# Patient Record
Sex: Female | Born: 1965
Health system: Southern US, Community
[De-identification: ages and names within clinical notes are randomized; demographics above are authoritative.]

## PROBLEM LIST (undated history)

## (undated) DIAGNOSIS — A609 Anogenital herpesviral infection, unspecified: Secondary | ICD-10-CM

## (undated) DIAGNOSIS — D509 Iron deficiency anemia, unspecified: Secondary | ICD-10-CM

## (undated) DIAGNOSIS — N841 Polyp of cervix uteri: Secondary | ICD-10-CM

## (undated) DIAGNOSIS — Z973 Presence of spectacles and contact lenses: Secondary | ICD-10-CM

## (undated) DIAGNOSIS — K648 Other hemorrhoids: Secondary | ICD-10-CM

## (undated) DIAGNOSIS — J302 Other seasonal allergic rhinitis: Secondary | ICD-10-CM

## (undated) DIAGNOSIS — N924 Excessive bleeding in the premenopausal period: Secondary | ICD-10-CM

## (undated) DIAGNOSIS — K644 Residual hemorrhoidal skin tags: Secondary | ICD-10-CM

## (undated) DIAGNOSIS — D259 Leiomyoma of uterus, unspecified: Secondary | ICD-10-CM

## (undated) DIAGNOSIS — K219 Gastro-esophageal reflux disease without esophagitis: Secondary | ICD-10-CM

## (undated) DIAGNOSIS — E785 Hyperlipidemia, unspecified: Secondary | ICD-10-CM

## (undated) HISTORY — DX: Hyperlipidemia, unspecified: E78.5

## (undated) HISTORY — DX: Anogenital herpesviral infection, unspecified: A60.9

## (undated) HISTORY — PX: DILATION AND CURETTAGE OF UTERUS: SHX78

---

## 2013-08-24 DIAGNOSIS — D649 Anemia, unspecified: Secondary | ICD-10-CM | POA: Insufficient documentation

## 2013-08-24 DIAGNOSIS — E785 Hyperlipidemia, unspecified: Secondary | ICD-10-CM | POA: Insufficient documentation

## 2013-08-24 DIAGNOSIS — J309 Allergic rhinitis, unspecified: Secondary | ICD-10-CM | POA: Insufficient documentation

## 2013-10-28 ENCOUNTER — Ambulatory Visit (INDEPENDENT_AMBULATORY_CARE_PROVIDER_SITE_OTHER): Payer: BC Managed Care – PPO | Admitting: Obstetrics & Gynecology

## 2013-10-28 ENCOUNTER — Encounter: Payer: Self-pay | Admitting: Obstetrics & Gynecology

## 2013-10-28 VITALS — BP 98/59 | HR 58 | Ht 64.0 in | Wt 193.2 lb

## 2013-10-28 DIAGNOSIS — Z1151 Encounter for screening for human papillomavirus (HPV): Secondary | ICD-10-CM

## 2013-10-28 DIAGNOSIS — N644 Mastodynia: Secondary | ICD-10-CM | POA: Diagnosis not present

## 2013-10-28 DIAGNOSIS — Z124 Encounter for screening for malignant neoplasm of cervix: Secondary | ICD-10-CM

## 2013-10-28 DIAGNOSIS — Z01419 Encounter for gynecological examination (general) (routine) without abnormal findings: Secondary | ICD-10-CM

## 2013-10-28 LAB — HM PAP SMEAR: HM Pap smear: NEGATIVE

## 2013-10-28 NOTE — Progress Notes (Signed)
Patient is here for yearly exam, new to the area since April from [place].  She is having left breast swelling.  Her paps have been normal for the last 3 years.  She does have a history of fibroids and had an ultrasound prior to moving here.  She was also taking Medroxyprogesterone tablets 10mg  but has stopped taking these.

## 2013-10-28 NOTE — Addendum Note (Signed)
Addended by: Erik Obey on: 10/28/2013 04:14 PM   Modules accepted: Orders

## 2013-10-28 NOTE — Patient Instructions (Signed)

## 2013-10-28 NOTE — Progress Notes (Signed)
Patient ID: Holly Wolfe, female   DOB: [birth year], 48 y.o.   MRN: [id number] Subjective:     Holly Wolfe is a 48 y.o. female here for a routine exam.  Current complaints: right breast pain/fullness like a 'tube'.  Pt noted it 1 week prev.  Denies skin changes.  She reports that she had been on Provera until Mar 2015. She stopped it because she didn't want to be on any hormones.  Then she restated it last week 'because she felt like maybe she should be taking it' although she denies any further abnormal bleeding at the time.  She reports that AFTER restarting the Provera she began to feel this breast 'fullness'.  She reports that since that time she has been rubbing and examining her breast often because she was worried.  Pt reports was started on Provera 10mg  daily in [place] due to 'perimenopausal sx'.  She reports that while on it her bleeding returned to normal but she does NOT want to be on hormones and has NOT has irreg menses since stopping the provera.    Gynecologic History Patient's last menstrual period was 09/18/2013. Contraception: menopausal Last Pap: 09/2012. Results were: normal Last mammogram: 04/2013. Results were: normal (per pt- waiting for records)   Obstetric History OB History  Gravida Para Term Preterm AB SAB TAB Ectopic Multiple Living  0 0 0 0 0 0 0 0 0 0        No current outpatient prescriptions on file prior to visit.   No current facility-administered medications on file prior to visit.     The following portions of the patient's history were reviewed and updated as appropriate: allergies, current medications, past family history, past medical history, past social history, past surgical history and problem list.  Review of Systems A comprehensive review of systems was negative.    Objective:    BP 98/59  Pulse 58  Ht 5\' 4"  (1.626 m)  Wt 193 lb 3.2 oz (87.635 kg)  BMI 33.15 kg/m2  LMP 09/18/2013  General Appearance:    Alert, cooperative, no distress,  appears stated age  Head:    Normocephalic, without obvious abnormality, atraumatic              Neck:   Supple, symmetrical, trachea midline, no adenopathy;    thyroid:  no enlargement/tenderness/nodules; no carotid   bruit or JVD  Back:     Symmetric, no curvature, ROM normal, no CVA tenderness  Lungs:     Clear to auscultation bilaterally, respirations unlabored  Chest Wall:    No tenderness or deformity   Heart:    Regular rate and rhythm, S1 and S2 normal, no murmur, rub   or gallop  Breast Exam:    No tenderness, masses, or nipple abnormality. ? Dense tubular area on lateral border of left breast.  No discrete mass palpated. No skin changes or nipple discharge noted     Abdomen:     Soft, non-tender, bowel sounds active all four quadrants,    no masses, no organomegaly  Genitalia:    Normal female without lesion, discharge or tenderness; stenotic os; no adnexal masses     Extremities:   Extremities normal, atraumatic, no cyanosis or edema  Pulses:   2+ and symmetric all extremities  Skin:   Skin color, texture, turgor normal, no rashes or lesions            Assessment:    Healthy female exam.  Pt encouraged to f/u in 2  weeks to repeat breast exam.  Pt encouraged NOT to check her breast for 2 weeks until after exam   Plan:   F/u PAP rec discontinue Provera  F/u if abnormal bleeding returns F/u 2 weeks for repeat breast exam

## 2013-11-01 LAB — CYTOLOGY - PAP

## 2013-11-02 ENCOUNTER — Ambulatory Visit: Payer: Self-pay | Admitting: Family Medicine

## 2013-11-09 ENCOUNTER — Ambulatory Visit (INDEPENDENT_AMBULATORY_CARE_PROVIDER_SITE_OTHER): Payer: BC Managed Care – PPO | Admitting: Family Medicine

## 2013-11-09 ENCOUNTER — Encounter: Payer: Self-pay | Admitting: Family Medicine

## 2013-11-09 VITALS — BP 104/75 | HR 66 | Ht 64.0 in | Wt 194.0 lb

## 2013-11-09 DIAGNOSIS — N6489 Other specified disorders of breast: Secondary | ICD-10-CM

## 2013-11-09 NOTE — Progress Notes (Signed)
    Subjective:    Patient ID: Holly Wolfe is a 48 y.o. female presenting with Follow-up  on 11/09/2013  HPI: Had restarted provera and had breast fullness.  Has stopped and had cycle and notes breast feels normal now. Here for re-check.  Review of Systems  Constitutional: Negative for fever and chills.  Respiratory: Negative for shortness of breath.   Cardiovascular: Negative for chest pain.  Gastrointestinal: Negative for nausea, vomiting and abdominal pain.  Genitourinary: Negative for dysuria.  Skin: Negative for rash.      Objective:    BP 104/75  Pulse 66  Ht 5\' 4"  (1.626 m)  Wt 194 lb (87.998 kg)  BMI 33.28 kg/m2  LMP 11/05/2013 Physical Exam  Constitutional: She is oriented to person, place, and time. She appears well-developed and well-nourished. No distress.  HENT:  Head: Normocephalic and atraumatic.  Eyes: No scleral icterus.  Neck: Neck supple.  Cardiovascular: Normal rate.   Pulmonary/Chest: Effort normal. Right breast exhibits no inverted nipple, no mass and no nipple discharge. Left breast exhibits no inverted nipple, no mass and no nipple discharge.  Abdominal: Soft.  Neurological: She is alert and oriented to person, place, and time.  Skin: Skin is warm and dry.  Psychiatric: She has a normal mood and affect.        Assessment & Plan:   No evidence of breast mass or fullness  Return in about 3 months (around 02/09/2014).

## 2013-11-09 NOTE — Patient Instructions (Signed)

## 2013-11-11 ENCOUNTER — Encounter: Payer: Self-pay | Admitting: Obstetrics & Gynecology

## 2014-04-20 ENCOUNTER — Telehealth: Payer: Self-pay

## 2014-04-20 NOTE — Telephone Encounter (Signed)
Patient has a break out and needs a rx for Valtrex 1 gr #30 5 refills to [facility] called it in.

## 2014-05-06 LAB — HM MAMMOGRAPHY: HM Mammogram: NORMAL

## 2014-08-30 ENCOUNTER — Encounter: Payer: Self-pay | Admitting: Obstetrics and Gynecology

## 2014-09-01 ENCOUNTER — Ambulatory Visit (INDEPENDENT_AMBULATORY_CARE_PROVIDER_SITE_OTHER): Payer: BLUE CROSS/BLUE SHIELD | Admitting: Obstetrics and Gynecology

## 2014-09-01 ENCOUNTER — Encounter: Payer: Self-pay | Admitting: Obstetrics and Gynecology

## 2014-09-01 VITALS — BP 102/70 | HR 81 | Ht 63.0 in | Wt 191.2 lb

## 2014-09-01 DIAGNOSIS — N939 Abnormal uterine and vaginal bleeding, unspecified: Secondary | ICD-10-CM | POA: Diagnosis not present

## 2014-09-01 DIAGNOSIS — R6882 Decreased libido: Secondary | ICD-10-CM

## 2014-09-01 DIAGNOSIS — F418 Other specified anxiety disorders: Secondary | ICD-10-CM

## 2014-09-01 DIAGNOSIS — G4700 Insomnia, unspecified: Secondary | ICD-10-CM | POA: Diagnosis not present

## 2014-09-01 DIAGNOSIS — F329 Major depressive disorder, single episode, unspecified: Secondary | ICD-10-CM

## 2014-09-01 DIAGNOSIS — N852 Hypertrophy of uterus: Secondary | ICD-10-CM

## 2014-09-01 DIAGNOSIS — N951 Menopausal and female climacteric states: Secondary | ICD-10-CM

## 2014-09-01 DIAGNOSIS — F419 Anxiety disorder, unspecified: Secondary | ICD-10-CM

## 2014-09-01 MED ORDER — LORAZEPAM 1 MG PO TABS: 1.0000 mg | ORAL_TABLET | Freq: Once | ORAL | Status: DC

## 2014-09-01 MED ORDER — SERTRALINE HCL 50 MG PO TABS: ORAL_TABLET | ORAL | Status: DC

## 2014-09-01 NOTE — Progress Notes (Signed)
Patient ID: Holly Wolfe, female   DOB: 23-May-1965, 49 y.o.   MRN: [id number]  CMA intake: Pt changing care to Dr. Keturah Barre as a recommendation from a friend. Has perimenopausal symptons:  crampy Breast tender Emotional Depressed at time * Sweating Difficulty going back to sleep Irregular bleeding (started 02/2014) Decreased libido * Severe headaches at times (not migraine)  GYN ENCOUNTER NOTE  Subjective:       Holly Wolfe is a 49 y.o. G0P0000 female is here for gynecologic evaluation of the following issues:  1. Perimenopausal symptoms. 2.  Anxiety. 3.  Insomnia. 4.  Decreased libido. 5.  Headaches.  Patient is a 49 year old nulliparous married female, perimenopausal, with the above complaints who presents for establishment of care. Patient began having irregular menstrual cycles back in January where she bled off and on for approximately 1 month.  Since then she has been having intermittent irregular cycles.  Bleeding is not heavy.  Patient did have a hysteroscopy/D&C in the past for abnormal uterine bleeding in [place]; no significant pathology was reported. Patient also has chronic insomnia, decreased libido, intermittent headaches. Patient relocated in the past year with her husband and parents in order to escape the cold of the [place].  She does identify with multiple stressors  She has not never been on antidepressants.  Depression questionnaire today is suspicious for mild anxiety/depression   Gynecologic History No LMP recorded (lmp unknown).  Had annual exam 10/29/14, pap/hpv: negative/negative, mammogram: 05/25/2014 normal per pt Obstetric History OB History  Gravida Para Term Preterm AB SAB TAB Ectopic Multiple Living  0 0 0 0 0 0 0 0 0 0         Past Medical History  Diagnosis Date  . Anemia   . Allergy     seasonal  . Hyperlipidemia   . Fibroids     Past Surgical History  Procedure Laterality Date  . Dilation and curettage of uterus  2014    frequent  menses    Current Outpatient Prescriptions on File Prior to Visit  Medication Sig Dispense Refill  . aspirin EC 81 MG tablet Take by mouth.    Marland Kitchen atorvastatin (LIPITOR) 10 MG tablet Take by mouth.    . Loratadine 10 MG CAPS Take by mouth.    . Omega-3 Fatty Acids (FISH OIL) 1000 MG CAPS Take 1,200 mg by mouth. With 360mg  omega 3     No current facility-administered medications on file prior to visit.    No Known Allergies  History   Social History  . Marital Status: Married    Spouse Name: N/A  . Number of Children: N/A  . Years of Education: N/A   Occupational History  . care giver to parents    Social History Main Topics  . Smoking status: Never Smoker   . Smokeless tobacco: Never Used  . Alcohol Use: No  . Drug Use: No  . Sexual Activity:    Partners: Male    Birth Control/ Protection: Surgical     Comment: vasectomy   Other Topics Concern  . Not on file   Social History Narrative    Family History  Problem Relation Age of Onset  . Heart disease Mother     afib  . Heart disease Father     2006  . Stroke Father     2007    The following portions of the patient's history were reviewed and updated as appropriate: allergies, current medications, past family history, past medical history, past  social history, past surgical history and problem list.  Review of Systems Per HPI  Objective:   BP 102/70 mmHg  Pulse 81  Ht 5\' 3"  (1.6 m)  Wt 191 lb 3 oz (86.722 kg)  BMI 33.88 kg/m2  LMP  (LMP Unknown) CONSTITUTIONAL: Well-developed, well-nourished female in no acute distress.  HENT:  Normocephalic, atraumatic.  NECK: Normal range of motion, supple, no masses.  Normal thyroid.  SKIN: Skin is warm and dry. No rash noted. Not diaphoretic. No erythema. No pallor. [place]: Alert and oriented to person, place, and time. PSYCHIATRIC: Normal mood and affect. Normal behavior. Normal judgment and thought content. CARDIOVASCULAR:Not Examined RESPIRATORY: Not  Examined BREASTS: Not Examined ABDOMEN: Soft, non distended; Non tender.  No Organomegaly. PELVIC:  External Genitalia: Normal  BUS: Normal  Vagina: Normal  Cervix: Normal; nulliparous  Uterus: slightly enlarged, approximately 12-14 weeks size; 1/4 tender; mobile  Adnexa: Normal  RV: Normal external exam  Bladder: Nontender MUSCULOSKELETAL: Normal range of motion. No tenderness.  No cyanosis, clubbing, or edema.     Assessment:   1. Abnormal uterine bleeding (AUB) - US Transvaginal Non-OB; Future - US Pelvis Complete; Future  2. Enlarged uterus - US Transvaginal Non-OB; Future - US Pelvis Complete; Future  3. Anxiety and depression - sertraline (ZOLOFT) 50 MG tablet; (1/2) tab (25mg ) po 1st week then 50mg  every day.  Dispense: 30 tablet; Refill: 1 - LORazepam (ATIVAN) 1 MG tablet; Take 1 tablet (1 mg total) by mouth once.  Dispense: 1 tablet; Refill: 0     Plan:  1.  Pelvic ultrasound. 2.  Return for endometrial biopsy. 3.  Menstrual calendar monitoring. 4.  Start Zoloft for depression/anxiety. 5.  Ativan prescription; to be taken morning of follow-up and endometrial biopsy

## 2014-09-02 ENCOUNTER — Ambulatory Visit: Payer: BLUE CROSS/BLUE SHIELD

## 2014-09-02 DIAGNOSIS — G4700 Insomnia, unspecified: Secondary | ICD-10-CM | POA: Insufficient documentation

## 2014-09-02 DIAGNOSIS — R519 Headache, unspecified: Secondary | ICD-10-CM | POA: Insufficient documentation

## 2014-09-02 DIAGNOSIS — N852 Hypertrophy of uterus: Secondary | ICD-10-CM

## 2014-09-02 DIAGNOSIS — F419 Anxiety disorder, unspecified: Secondary | ICD-10-CM

## 2014-09-02 DIAGNOSIS — N939 Abnormal uterine and vaginal bleeding, unspecified: Secondary | ICD-10-CM

## 2014-09-02 DIAGNOSIS — F329 Major depressive disorder, single episode, unspecified: Secondary | ICD-10-CM | POA: Insufficient documentation

## 2014-09-02 DIAGNOSIS — R51 Headache: Secondary | ICD-10-CM

## 2014-09-02 DIAGNOSIS — R6882 Decreased libido: Secondary | ICD-10-CM | POA: Insufficient documentation

## 2014-09-27 ENCOUNTER — Ambulatory Visit (INDEPENDENT_AMBULATORY_CARE_PROVIDER_SITE_OTHER): Payer: BLUE CROSS/BLUE SHIELD | Admitting: Obstetrics and Gynecology

## 2014-09-27 ENCOUNTER — Encounter: Payer: Self-pay | Admitting: Obstetrics and Gynecology

## 2014-09-27 VITALS — BP 108/71 | HR 60 | Ht 63.0 in | Wt 188.4 lb

## 2014-09-27 DIAGNOSIS — N939 Abnormal uterine and vaginal bleeding, unspecified: Secondary | ICD-10-CM

## 2014-09-27 DIAGNOSIS — F418 Other specified anxiety disorders: Secondary | ICD-10-CM

## 2014-09-27 NOTE — Progress Notes (Signed)
Patient ID: Ladora Osterberg, female   DOB: [birth year], 49 y.o.   MRN: [id number] 1 month f/u on aub See u/s report-  Needs emb- not ready today  Chief complaint: 1.  Abnormal uterine bleeding. 2.  Uterine fibroids. 3.  Anxiety.  Patient was started on Zoloft for anxiety.  She was given Ativan for preparation for endometrial biopsy, which was to be at this follow-up appointment.  Ultrasound was completed and results are to be reviewed today.  Patient does have endometrial fluid collection at the level of the cervix and does require endometrial sampling.  Patient left before being seen.  Patient left, at 8:25 AM.  She was being prepared to be seen but was dissatisfied with wait (appointment delay was due to laboring patient).  We'll contact patient to reschedule appointment if desired.  If not, records will be available for transfer to new physician.

## 2014-09-27 NOTE — Patient Instructions (Signed)
1.  Patient will be contacted to reschedule endometrial biopsy appointment (if desired).

## 2014-09-29 ENCOUNTER — Telehealth: Payer: Self-pay | Admitting: Obstetrics and Gynecology

## 2014-09-29 NOTE — Telephone Encounter (Signed)
Pts questions answered. Wants to have emb tomorrow. Advised I will ask Mad and let her know. She is very anxious.

## 2014-09-29 NOTE — Telephone Encounter (Signed)
Patient called requesting a call back to discuss upcoming biopsy. You can reach her at [phone].Thanks

## 2014-09-30 NOTE — Telephone Encounter (Signed)
Pt aware per mad keep appt for Tuesday at 7:30. He will not be in on Friday. Given reassurance.

## 2014-10-03 ENCOUNTER — Telehealth: Payer: Self-pay | Admitting: Obstetrics and Gynecology

## 2014-10-03 NOTE — Telephone Encounter (Signed)
PT IS HAVING AND ENDO BX AND SHE WAS GIVEN A RELAXER AND HSE WANTED TO KNOW EXACTLY WHAT TIME SHE NEEDS TO TAKE IT.

## 2014-10-03 NOTE — Telephone Encounter (Signed)
Pt to take 30-45 min. Before appt. In the am and was made aware that front desk not here first thing and that I will come and get her from lobby.

## 2014-10-04 ENCOUNTER — Ambulatory Visit (INDEPENDENT_AMBULATORY_CARE_PROVIDER_SITE_OTHER): Payer: BLUE CROSS/BLUE SHIELD | Admitting: Obstetrics and Gynecology

## 2014-10-04 DIAGNOSIS — N852 Hypertrophy of uterus: Secondary | ICD-10-CM

## 2014-10-04 DIAGNOSIS — F418 Other specified anxiety disorders: Secondary | ICD-10-CM

## 2014-10-04 DIAGNOSIS — F329 Major depressive disorder, single episode, unspecified: Secondary | ICD-10-CM

## 2014-10-04 DIAGNOSIS — F419 Anxiety disorder, unspecified: Secondary | ICD-10-CM

## 2014-10-04 DIAGNOSIS — N951 Menopausal and female climacteric states: Secondary | ICD-10-CM | POA: Diagnosis not present

## 2014-10-04 DIAGNOSIS — D259 Leiomyoma of uterus, unspecified: Secondary | ICD-10-CM | POA: Insufficient documentation

## 2014-10-04 DIAGNOSIS — N939 Abnormal uterine and vaginal bleeding, unspecified: Secondary | ICD-10-CM | POA: Diagnosis not present

## 2014-10-04 NOTE — Progress Notes (Signed)
Chief complaint: 1.  Follow-up on ultrasound. 2.  Endometrial biopsy. 3.  Anxiety.  Patient presents today for endometrial biopsy because of abnormal uterine bleeding. Findings from ultrasound were reviewed with summary: 1.  Uterine fibroid. 2.  Endocervical fluid collection. 3.  Endometrium measuring 12 mm 4.  Simple ovarian cyst.  Left ovary. Patient has ongoing anxiety.  She did not start Zoloft as recommended because she states that the symptoms are not every day; has been reinforced the fact that she did not like taking medication. Patient did take Ativan prior to returning here for biopsy.  Past mental history, past surgical history: Past problem list, medications, and allergies were reviewed.  OBJECTIVE: LMP 09/17/2014 Pleasant, anxious female in no acute distres Abdomen soft, nontender. Pelvic exam: Midline uterus of normal size and shape.  Endometrial Biopsy Procedure Note  Pre-operative Diagnosis:  1.  Abnormal uterine bleeding. 2.  Endocervical fluid collection. 3.  Uterine fibroid  Post-operative Diagnosis: Same as above  Indications: Abnormal uterine bleeding  Procedure Details   Urine pregnancy test was not done.  The risks (including infection, bleeding, pain, and uterine perforation) and benefits of the procedure were explained to the patient and Verbal informed consent was obtained.  Antibiotic prophylaxis against endocarditis was not indicated.   The patient was placed in the dorsal lithotomy position.  Bimanual exam showed the uterus to be in the neutral position.  A Graves' speculum inserted in the vagina, and the cervix prepped with povidone iodine.  Endocervical curettage with a Kevorkian curette was not performed.  Paracervical block with 10 cc of lidocaine 1% was instilled at the 3:00 and 9:00 positions in standard fashion.   A sharp tenaculum was applied to the anterior lip of the cervix for stabilization.  A sterile uterine sound was used to Dilate  the cervix and sound the uterus to a depth of 8cm.  A Mylex 37mm curette was used to sample the endometrium.  Sample was sent for pathologic examination.  Condition: Stable  Complications: None  Plan:  The patient was advised to call for any fever or for prolonged or severe pain or bleeding. She was advised to use Advil as needed for mild to moderate pain. She was advised to avoid vaginal intercourse for 48 hours or until the bleeding has completely stopped.  Attending Physician Documentation: Brayton Mars, MD   IMPRESSION: 1.  Abnormal uterine bleeding. 2.  Uterine fibroids. 3.  Endocervical fluid collection. 4.  Anxiety.  PLAN: 1.  Paracervical block. 2.  Endocervical canal dilation. 3.  Endometrial biopsy. 4.  Return in 10 days for follow-up and further management planning. 5.  Continue with menstrual calendar, monitoring. 6.  Will discuss ongoing antianxiety medicine next visit

## 2014-10-04 NOTE — Patient Instructions (Signed)
1.  Advil when necessary cramping. 2.  Return in 10 days for follow up on endometrial biopsy

## 2014-10-05 NOTE — Addendum Note (Signed)
Addended by: Elouise Munroe on: 10/05/2014 01:03 PM   Modules accepted: Orders

## 2014-10-06 LAB — PATHOLOGY

## 2014-10-19 ENCOUNTER — Ambulatory Visit (INDEPENDENT_AMBULATORY_CARE_PROVIDER_SITE_OTHER): Payer: BLUE CROSS/BLUE SHIELD | Admitting: Obstetrics and Gynecology

## 2014-10-19 ENCOUNTER — Encounter: Payer: Self-pay | Admitting: Obstetrics and Gynecology

## 2014-10-19 VITALS — BP 110/70 | HR 72 | Ht 64.0 in | Wt 188.4 lb

## 2014-10-19 DIAGNOSIS — N939 Abnormal uterine and vaginal bleeding, unspecified: Secondary | ICD-10-CM | POA: Diagnosis not present

## 2014-10-19 DIAGNOSIS — D259 Leiomyoma of uterus, unspecified: Secondary | ICD-10-CM

## 2014-10-19 NOTE — Patient Instructions (Signed)
1.  Maintain menstrual calendar, monitoring. 2.  Return in March 2017 for follow-up on abnormal uterine bleeding. 3.  Endometrial biopsy was benign. 4.  Ultrasound shows a 2.1 cm fibroid, which is not significant at this time.

## 2014-10-19 NOTE — Progress Notes (Signed)
Patient ID: Holly Wolfe, female   DOB: 06-23-1965, 49 y.o.   MRN: [id number] emb results  Chief complaint: 1.  Abnormal uterine bleeding. 2.  History of uterine fibroid.  Patient presents today for follow-up on endometrial biopsy done because of abnormal uterine bleeding and abnormal pelvic ultrasound with an endocervical fluid collection being identified measuring 1.2 cm.  Endometrial biopsy was benign. Previous ultrasound demonstrated 2.1 cm uterine fibroid and a 12 mm fluid collection in endocervical canal.  IMPRESSION: 1.  Benign endometrial biopsy. 2.  Ultrasound shows uterine fibroid.  PLAN: 1.  Maintain menstrual calendar. 2.  Return in March 2017 for follow-up and further management planning. 3.  Patient declines Provera withdrawal on a monthly basis as an alternative treatment plan.  A total of 15 minutes were spent face-to-face with the patient during this encounter and over half of that time dealt with counseling and coordination of care.  Brayton Mars, MD

## 2014-10-27 ENCOUNTER — Encounter: Payer: Self-pay | Admitting: Gynecology

## 2014-10-27 ENCOUNTER — Ambulatory Visit (INDEPENDENT_AMBULATORY_CARE_PROVIDER_SITE_OTHER): Payer: BLUE CROSS/BLUE SHIELD | Admitting: Gynecology

## 2014-10-27 VITALS — BP 118/76 | Ht 65.0 in | Wt 186.0 lb

## 2014-10-27 DIAGNOSIS — N926 Irregular menstruation, unspecified: Secondary | ICD-10-CM

## 2014-10-27 MED ORDER — MEDROXYPROGESTERONE ACETATE 10 MG PO TABS: 10.0000 mg | ORAL_TABLET | Freq: Every day | ORAL | Status: DC

## 2014-10-27 NOTE — Patient Instructions (Addendum)
Take the Provera pill every day for 12 days. This should bring on a withdrawal bleed. Repeat this every 8 weeks if you do not have a regular intervening period.  Call me if prolonged or atypical bleeding.  Schedule your colonoscopy with either:  [facility]   Address: [address], [place], [place] [address]  [phone]    or  [facility]  Address: [address], [place], [place] [address]  [phone]     You may obtain a copy of any labs that were done today by logging onto MyChart as outlined in the instructions provided with your AVS (after visit summary). The office will not call with normal lab results but certainly if there are any significant abnormalities then we will contact you.   Health Maintenance Adopting a healthy lifestyle and getting preventive care can go a long way to promote health and wellness. Talk with your health care provider about what schedule of regular examinations is right for you. This is a good chance for you to check in with your provider about disease prevention and staying healthy. In between checkups, there are plenty of things you can do on your own. Experts have done a lot of research about which lifestyle changes and preventive measures are most likely to keep you healthy. Ask your health care provider for more information. WEIGHT AND DIET  Eat a healthy diet  Be sure to include plenty of vegetables, fruits, low-fat dairy products, and lean protein.  Do not eat a lot of foods high in solid fats, added sugars, or salt.  Get regular exercise. This is one of the most important things you can do for your health.  Most adults should exercise for at least 150 minutes each week. The exercise should increase your heart rate and make you sweat (moderate-intensity exercise).  Most adults should also do strengthening exercises at least twice a week. This is in addition to the moderate-intensity exercise.  Maintain a  healthy weight  Body mass index (BMI) is a measurement that can be used to identify possible weight problems. It estimates body fat based on height and weight. Your health care provider can help determine your BMI and help you achieve or maintain a healthy weight.  For females 41 years of age and older:   A BMI below 18.5 is considered underweight.  A BMI of 18.5 to 24.9 is normal.  A BMI of 25 to 29.9 is considered overweight.  A BMI of 30 and above is considered obese.  Watch levels of cholesterol and blood lipids  You should start having your blood tested for lipids and cholesterol at 51 years of age, then have this test every 5 years.  You may need to have your cholesterol levels checked more often if:  Your lipid or cholesterol levels are high.  You are older than 51 years of age.  You are at high risk for heart disease.  CANCER SCREENING   Lung Cancer  Lung cancer screening is recommended for adults 58-13 years old who are at high risk for lung cancer because of a history of smoking.  A yearly low-dose CT scan of the lungs is recommended for people who:  Currently smoke.  Have quit within the past 15 years.  Have at least a 30-pack-year history of smoking. A pack year is smoking an average of one pack of cigarettes a day for 1 year.  Yearly screening should continue until it has been 15 years since  you quit.  Yearly screening should stop if you develop a health problem that would prevent you from having lung cancer treatment.  Breast Cancer  Practice breast self-awareness. This means understanding how your breasts normally appear and feel.  It also means doing regular breast self-exams. Let your health care provider know about any changes, no matter how small.  If you are in your 20s or 30s, you should have a clinical breast exam (CBE) by a health care provider every 1-3 years as part of a regular health exam.  If you are 73 or older, have a CBE every year.  Also consider having a breast X-ray (mammogram) every year.  If you have a family history of breast cancer, talk to your health care provider about genetic screening.  If you are at high risk for breast cancer, talk to your health care provider about having an MRI and a mammogram every year.  Breast cancer gene (BRCA) assessment is recommended for women who have family members with BRCA-related cancers. BRCA-related cancers include:  Breast.  Ovarian.  Tubal.  Peritoneal cancers.  Results of the assessment will determine the need for genetic counseling and BRCA1 and BRCA2 testing. Cervical Cancer Routine pelvic examinations to screen for cervical cancer are no longer recommended for nonpregnant women who are considered low risk for cancer of the pelvic organs (ovaries, uterus, and vagina) and who do not have symptoms. A pelvic examination may be necessary if you have symptoms including those associated with pelvic infections. Ask your health care provider if a screening pelvic exam is right for you.   The Pap test is the screening test for cervical cancer for women who are considered at risk.  If you had a hysterectomy for a problem that was not cancer or a condition that could lead to cancer, then you no longer need Pap tests.  If you are older than 65 years, and you have had normal Pap tests for the past 10 years, you no longer need to have Pap tests.  If you have had past treatment for cervical cancer or a condition that could lead to cancer, you need Pap tests and screening for cancer for at least 20 years after your treatment.  If you no longer get a Pap test, assess your risk factors if they change (such as having a new sexual partner). This can affect whether you should start being screened again.  Some women have medical problems that increase their chance of getting cervical cancer. If this is the case for you, your health care provider may recommend more frequent screening and  Pap tests.  The human papillomavirus (HPV) test is another test that may be used for cervical cancer screening. The HPV test looks for the virus that can cause cell changes in the cervix. The cells collected during the Pap test can be tested for HPV.  The HPV test can be used to screen women 70 years of age and older. Getting tested for HPV can extend the interval between normal Pap tests from three to five years.  An HPV test also should be used to screen women of any age who have unclear Pap test results.  After 51 years of age, women should have HPV testing as often as Pap tests.  Colorectal Cancer  This type of cancer can be detected and often prevented.  Routine colorectal cancer screening usually begins at 51 years of age and continues through 51 years of age.  Your health care provider may  recommend screening at an earlier age if you have risk factors for colon cancer.  Your health care provider may also recommend using home test kits to check for hidden blood in the stool.  A small camera at the end of a tube can be used to examine your colon directly (sigmoidoscopy or colonoscopy). This is done to check for the earliest forms of colorectal cancer.  Routine screening usually begins at age 93.  Direct examination of the colon should be repeated every 5-10 years through 51 years of age. However, you may need to be screened more often if early forms of precancerous polyps or small growths are found. Skin Cancer  Check your skin from head to toe regularly.  Tell your health care provider about any new moles or changes in moles, especially if there is a change in a mole's shape or color.  Also tell your health care provider if you have a mole that is larger than the size of a pencil eraser.  Always use sunscreen. Apply sunscreen liberally and repeatedly throughout the day.  Protect yourself by wearing long sleeves, pants, a wide-brimmed hat, and sunglasses whenever you are  outside. HEART DISEASE, DIABETES, AND HIGH BLOOD PRESSURE   Have your blood pressure checked at least every 1-2 years. High blood pressure causes heart disease and increases the risk of stroke.  If you are between 79 years and 75 years old, ask your health care provider if you should take aspirin to prevent strokes.  Have regular diabetes screenings. This involves taking a blood sample to check your fasting blood sugar level.  If you are at a normal weight and have a low risk for diabetes, have this test once every three years after 51 years of age.  If you are overweight and have a high risk for diabetes, consider being tested at a younger age or more often. PREVENTING INFECTION  Hepatitis B  If you have a higher risk for hepatitis B, you should be screened for this virus. You are considered at high risk for hepatitis B if:  You were born in a country where hepatitis B is common. Ask your health care provider which countries are considered high risk.  Your parents were born in a high-risk country, and you have not been immunized against hepatitis B (hepatitis B vaccine).  You have HIV or AIDS.  You use needles to inject street drugs.  You live with someone who has hepatitis B.  You have had sex with someone who has hepatitis B.  You get hemodialysis treatment.  You take certain medicines for conditions, including cancer, organ transplantation, and autoimmune conditions. Hepatitis C  Blood testing is recommended for:  Everyone born from [birth year] through 12/14/1965.  Anyone with known risk factors for hepatitis C. Sexually transmitted infections (STIs)  You should be screened for sexually transmitted infections (STIs) including gonorrhea and chlamydia if:  You are sexually active and are younger than 51 years of age.  You are older than 51 years of age and your health care provider tells you that you are at risk for this type of infection.  Your sexual activity has changed since  you were last screened and you are at an increased risk for chlamydia or gonorrhea. Ask your health care provider if you are at risk.  If you do not have HIV, but are at risk, it may be recommended that you take a prescription medicine daily to prevent HIV infection. This is called pre-exposure prophylaxis (PrEP). You  are considered at risk if:  You are sexually active and do not regularly use condoms or know the HIV status of your partner(s).  You take drugs by injection.  You are sexually active with a partner who has HIV. Talk with your health care provider about whether you are at high risk of being infected with HIV. If you choose to begin PrEP, you should first be tested for HIV. You should then be tested every 3 months for as long as you are taking PrEP.  PREGNANCY   If you are premenopausal and you may become pregnant, ask your health care provider about preconception counseling.  If you may become pregnant, take 400 to 800 micrograms (mcg) of folic acid every day.  If you want to prevent pregnancy, talk to your health care provider about birth control (contraception). OSTEOPOROSIS AND MENOPAUSE   Osteoporosis is a disease in which the bones lose minerals and strength with aging. This can result in serious bone fractures. Your risk for osteoporosis can be identified using a bone density scan.  If you are 18 years of age or older, or if you are at risk for osteoporosis and fractures, ask your health care provider if you should be screened.  Ask your health care provider whether you should take a calcium or vitamin D supplement to lower your risk for osteoporosis.  Menopause may have certain physical symptoms and risks.  Hormone replacement therapy may reduce some of these symptoms and risks. Talk to your health care provider about whether hormone replacement therapy is right for you.  HOME CARE INSTRUCTIONS   Schedule regular health, dental, and eye exams.  Stay current with  your immunizations.   Do not use any tobacco products including cigarettes, chewing tobacco, or electronic cigarettes.  If you are pregnant, do not drink alcohol.  If you are breastfeeding, limit how much and how often you drink alcohol.  Limit alcohol intake to no more than 1 drink per day for nonpregnant women. One drink equals 12 ounces of beer, 5 ounces of wine, or 1 ounces of hard liquor.  Do not use street drugs.  Do not share needles.  Ask your health care provider for help if you need support or information about quitting drugs.  Tell your health care provider if you often feel depressed.  Tell your health care provider if you have ever been abused or do not feel safe at home. Document Released: 08/06/2010 Document Revised: 06/07/2013 Document Reviewed: 12/23/2012 [facility] Patient Information 2015 [place], [place]. This information is not intended to replace advice given to you by your health care provider. Make sure you discuss any questions you have with your health care provider.

## 2014-10-27 NOTE — Progress Notes (Signed)
Holly Wolfe 09/08/1965 [id number]        49 y.o.  G0P0000 new patient who presents complaining of a history of irregular bleeding over the past year. Nodes regular monthly menses up until December 2015 at which point they became more sporadic coming earlier or later with some skips. Starting in June in July she started bleeding more frequently every 2 weeks and then had a heavier bleeding episode in August. Saw a physician who ordered an ultrasound which showed a small fundal leiomyoma 2.1 cm right and left ovaries grossly normal with small simple left ovarian cyst that 2 cm an endometrial echo 12.9 mm. They noted a complex fluid area with debris at the cervix questionable nabothian cyst versus blood collection. She subsequently underwent an endometrial biopsy which showed proliferative endometrium no evidence of hyperplasia or carcinoma.  She notes that she has done no bleeding since early to mid August. No hot flashes, night sweats or vaginal dryness.  Past medical history,surgical history, problem list, medications, allergies, family history and social history were all reviewed and documented as reviewed in the EPIC chart.  ROS:  Performed with pertinent positives and negatives included in the history, assessment and plan.   Additional significant findings :  none   Exam: Kim Counsellor Vitals:   10/27/14 1400  BP: 118/76  Height: 5\' 5"  (1.651 m)  Weight: 186 lb (84.369 kg)   General appearance:  Normal affect, orientation and appearance. Skin: Grossly normal HEENT: Without gross lesions.  No cervical or supraclavicular adenopathy. Thyroid normal.  Lungs:  Clear without wheezing, rales or rhonchi Cardiac: RR, without RMG Abdominal:  Soft, nontender, without masses, guarding, rebound, organomegaly or hernia Breasts:  Examined lying and sitting without masses, retractions, discharge or axillary adenopathy. Pelvic:  Ext/BUS/vagina normal  Cervix normal  Uterus anteverted, normal  size, shape and contour, midline and mobile nontender   Adnexa  Without masses or tenderness    Anus and perineum  Normal   Rectovaginal  Normal sphincter tone without palpated masses or tenderness.    Assessment/Plan:  49 y.o. G0P0000 female with perimenopausal irregularity. Not having significant other symptoms such as hot flushes or night sweats. Endometrial sampling benign. Options of management reviewed to include observation versus hormonal manipulation. Recommend intermittent progesterone withdrawal this coming year with Provera 10 mg 12 days every 8 weeks if without menses. Start first withdrawal now and then repeat if amenorrheic 8 weeks. If has spontaneous menses in the interim them will monitor. If prolonged or atypical bleeding continues she knows to call and we'll pursue sonohysterogram. If regular withdrawal bleeding or no withdrawal bleeding at the end of the Provera them will monitor and she'll represent at one year for her annual exam.  Review of her chart shows:  1. Pap smear/HPV negative 10/2013. No Pap smear done today. No history of significant abnormal Pap smears. 2. Mammography 05/2014. Continue with annual mammography. SBE monthly reviewed. 3. Colonoscopy never. Recommend his screening colonoscopy as she is over the age of 75.    Anastasio Auerbach MD, 2:39 PM 10/27/2014

## 2015-04-19 ENCOUNTER — Ambulatory Visit: Payer: BLUE CROSS/BLUE SHIELD | Admitting: Obstetrics and Gynecology

## 2015-07-11 ENCOUNTER — Encounter: Payer: Self-pay | Admitting: *Deleted

## 2015-07-12 ENCOUNTER — Encounter
Admission: RE | Disposition: A | Discharge status: Home / Self Care | Payer: Self-pay | Source: Ambulatory Visit | Attending: Unknown Physician Specialty

## 2015-07-12 ENCOUNTER — Ambulatory Visit: Payer: BLUE CROSS/BLUE SHIELD | Admitting: Anesthesiology

## 2015-07-12 ENCOUNTER — Ambulatory Visit
Admission: RE | Admit: 2015-07-12 | Discharge: 2015-07-12 | Disposition: A | Discharge status: Home / Self Care | Payer: BLUE CROSS/BLUE SHIELD | Source: Ambulatory Visit | Attending: Unknown Physician Specialty | Admitting: Unknown Physician Specialty

## 2015-07-12 DIAGNOSIS — D649 Anemia, unspecified: Secondary | ICD-10-CM | POA: Insufficient documentation

## 2015-07-12 DIAGNOSIS — F419 Anxiety disorder, unspecified: Secondary | ICD-10-CM | POA: Diagnosis not present

## 2015-07-12 DIAGNOSIS — Z7982 Long term (current) use of aspirin: Secondary | ICD-10-CM | POA: Diagnosis not present

## 2015-07-12 DIAGNOSIS — K640 First degree hemorrhoids: Secondary | ICD-10-CM | POA: Diagnosis not present

## 2015-07-12 DIAGNOSIS — E785 Hyperlipidemia, unspecified: Secondary | ICD-10-CM | POA: Diagnosis not present

## 2015-07-12 DIAGNOSIS — F329 Major depressive disorder, single episode, unspecified: Secondary | ICD-10-CM | POA: Insufficient documentation

## 2015-07-12 DIAGNOSIS — Z79899 Other long term (current) drug therapy: Secondary | ICD-10-CM | POA: Insufficient documentation

## 2015-07-12 DIAGNOSIS — Z1211 Encounter for screening for malignant neoplasm of colon: Secondary | ICD-10-CM | POA: Insufficient documentation

## 2015-07-12 DIAGNOSIS — K644 Residual hemorrhoidal skin tags: Secondary | ICD-10-CM | POA: Insufficient documentation

## 2015-07-12 HISTORY — PX: COLONOSCOPY WITH PROPOFOL: SHX5780

## 2015-07-12 SURGERY — COLONOSCOPY WITH PROPOFOL
Anesthesia: General

## 2015-07-12 MED ORDER — PROPOFOL 500 MG/50ML IV EMUL
INTRAVENOUS | Status: DC | PRN
  Administered 2015-07-12: 150 ug/kg/min via INTRAVENOUS

## 2015-07-12 MED ORDER — PROPOFOL 10 MG/ML IV BOLUS
INTRAVENOUS | Status: DC | PRN
  Administered 2015-07-12: 70 mg via INTRAVENOUS

## 2015-07-12 MED ORDER — SODIUM CHLORIDE 0.9 % IV SOLN: INTRAVENOUS | Status: DC

## 2015-07-12 MED ORDER — SODIUM CHLORIDE 0.9 % IV SOLN
INTRAVENOUS | Status: DC
  Administered 2015-07-12: 1000 mL via INTRAVENOUS

## 2015-07-12 MED ORDER — FENTANYL CITRATE (PF) 100 MCG/2ML IJ SOLN
INTRAMUSCULAR | Status: DC | PRN
  Administered 2015-07-12: 50 ug via INTRAVENOUS

## 2015-07-12 MED ORDER — PHENYLEPHRINE HCL 10 MG/ML IJ SOLN
INTRAMUSCULAR | Status: DC | PRN
Start: 2015-07-12 — End: 2015-07-12
  Administered 2015-07-12 (×5): 100 ug via INTRAVENOUS

## 2015-07-12 NOTE — Op Note (Signed)
[facility] Patient Name: Holly Wolfe Procedure Date: 07/12/2015 10:05 AM MRN: [id number] Account #: [id number] Date of Birth: 1965-05-17 Admit Type: Outpatient Age: 50 Room: [facility] ENDO ROOM 4 Gender: Female Note Status: Finalized Procedure:            Colonoscopy Indications:          Screening for colorectal malignant neoplasm Providers:            Manya Silvas, MD Referring MD:         Caprice Renshaw (Referring MD) Medicines:            Propofol per Anesthesia Complications:        No immediate complications. Procedure:            Pre-Anesthesia Assessment:                       - After reviewing the risks and benefits, the patient                        was deemed in satisfactory condition to undergo the                        procedure.                       After obtaining informed consent, the colonoscope was                        passed under direct vision. Throughout the procedure,                        the patient's blood pressure, pulse, and oxygen                        saturations were monitored continuously. The                        Colonoscope was introduced through the anus and                        advanced to the the cecum, identified by appendiceal                        orifice and ileocecal valve. The colonoscopy was                        performed without difficulty. The patient tolerated the                        procedure well. The quality of the bowel preparation                        was excellent. Findings:      Internal hemorrhoids were found during endoscopy. The hemorrhoids were       small and Grade I (internal hemorrhoids that do not prolapse).      One External hemorrhoid was found. The hemorrhoid was small.      The exam was otherwise without abnormality. Prep was excellent. Impression:           - Internal hemorrhoids.                       -  External hemorrhoids.                       - The  examination was otherwise normal.                       - No specimens collected. Recommendation:       - Repeat colonoscopy in 10 years for screening purposes. Manya Silvas, MD 07/12/2015 10:27:55 AM This report has been signed electronically. Number of Addenda: 0 Note Initiated On: 07/12/2015 10:05 AM Scope Withdrawal Time: 0 hours 10 minutes 48 seconds  Total Procedure Duration: 0 hours 15 minutes 34 seconds       [facility]

## 2015-07-12 NOTE — H&P (Signed)
Primary Care Physician:  Marcello Fennel, MD Primary Gastroenterologist:  Dr. Vira Agar  Pre-Procedure History & Physical: HPI:  Holly Wolfe is a 50 y.o. female is here for an colonoscopy.   Past Medical History  Diagnosis Date  . Anemia   . Allergy     seasonal  . Hyperlipidemia   . Fibroids   . HSV (herpes simplex virus) anogenital infection   . Allergic rhinitis     Past Surgical History  Procedure Laterality Date  . Dilation and curettage of uterus  2014    frequent menses    Prior to Admission medications   Medication Sig Start Date End Date Taking? Authorizing Provider  albuterol (ACCUNEB) 1.25 MG/3ML nebulizer solution Take 1 ampule by nebulization every 6 (six) hours as needed for wheezing.   Yes Historical Provider, MD  Ascorbic Acid (VITAMIN C) 1000 MG tablet Take 1,000 mg by mouth daily.   Yes Historical Provider, MD  fluticasone (FLONASE) 50 MCG/ACT nasal spray Place 2 sprays into both nostrils daily.   Yes Historical Provider, MD  aspirin EC 81 MG tablet Take by mouth.    Historical Provider, MD  atorvastatin (LIPITOR) 10 MG tablet Take by mouth.    Historical Provider, MD  BIOGAIA PROBIOTIC (BIOGAIA PROBIOTIC) LIQD Take by mouth daily at 8 pm.    Historical Provider, MD  cholecalciferol (VITAMIN D) 1000 UNITS tablet Take 5,000 Units by mouth daily.    Historical Provider, MD  ferrous sulfate 325 (65 FE) MG tablet Take 325 mg by mouth 2 (two) times a week.    Historical Provider, MD  Loratadine 10 MG CAPS Take by mouth.    Historical Provider, MD  LORazepam (ATIVAN) 1 MG tablet Take 1 tablet (1 mg total) by mouth once. 09/01/14   Alanda Slim Defrancesco, MD  medroxyPROGESTERone (PROVERA) 10 MG tablet Take 1 tablet (10 mg total) by mouth daily. 10/27/14   Anastasio Auerbach, MD  Multiple Vitamins-Minerals (MULTIVITAMIN WITH MINERALS) tablet Take 1 tablet by mouth daily.    Historical Provider, MD  Omega-3 Fatty Acids (FISH OIL) 1000 MG CAPS Take 1,200 mg by mouth.  With 360mg  omega 3    Historical Provider, MD  valACYclovir (VALTREX) 1000 MG tablet Take 1,000 mg by mouth as needed.    Historical Provider, MD    Allergies as of 05/04/2015  . (No Known Allergies)    Family History  Problem Relation Age of Onset  . Heart disease Mother     afib  . Heart disease Father     2006  . Stroke Father     2007    Social History   Social History  . Marital Status: Married    Spouse Name: N/A  . Number of Children: N/A  . Years of Education: N/A   Occupational History  . care giver to parents    Social History Main Topics  . Smoking status: Never Smoker   . Smokeless tobacco: Never Used  . Alcohol Use: No  . Drug Use: No  . Sexual Activity:    Partners: Male    Birth Control/ Protection: Surgical     Comment: vasectomy-1st intercourse 50 yo-More than 5 partners   Other Topics Concern  . Not on file   Social History Narrative    Review of Systems: See HPI, otherwise negative ROS  Physical Exam: BP 121/66 mmHg  Pulse 80  Temp(Src) 97.8 F (36.6 C) (Tympanic)  Resp 16  Ht 5\' 4"  (1.626 m)  Wt 78.16 kg (172 lb 5 oz)  BMI 29.56 kg/m2  SpO2 100% General:   Alert,  pleasant and cooperative in NAD Head:  Normocephalic and atraumatic. Neck:  Supple; no masses or thyromegaly. Lungs:  Clear throughout to auscultation.    Heart:  Regular rate and rhythm. Abdomen:  Soft, nontender and nondistended. Normal bowel sounds, without guarding, and without rebound.   Neurologic:  Alert and  oriented x4;  grossly normal neurologically.  Impression/Plan: Holly Wolfe is here for an colonoscopy to be performed for screening  Risks, benefits, limitations, and alternatives regarding  colonoscopy have been reviewed with the patient.  Questions have been answered.  All parties agreeable.   Gaylyn Cheers, MD  07/12/2015, 10:03 AM

## 2015-07-12 NOTE — Anesthesia Postprocedure Evaluation (Signed)
Anesthesia Post Note  Patient: Holly Wolfe  Procedure(s) Performed: Procedure(s) (LRB): COLONOSCOPY WITH PROPOFOL (N/A)  Patient location during evaluation: Endoscopy Anesthesia Type: General Level of consciousness: awake and alert Pain management: pain level controlled Vital Signs Assessment: post-procedure vital signs reviewed and stable Respiratory status: spontaneous breathing, nonlabored ventilation, respiratory function stable and patient connected to nasal cannula oxygen Cardiovascular status: blood pressure returned to baseline and stable Postop Assessment: no signs of nausea or vomiting Anesthetic complications: no    Last Vitals:  Filed Vitals:   07/12/15 1049 07/12/15 1059  BP: 104/50 100/53  Pulse: 62 58  Temp:    Resp: 13 16    Last Pain: There were no vitals filed for this visit.               Martha Clan

## 2015-07-12 NOTE — Transfer of Care (Addendum)
Immediate Anesthesia Transfer of Care Note  Patient: Holly Wolfe  Procedure(s) Performed: Procedure(s): COLONOSCOPY WITH PROPOFOL (N/A)  Patient Location: PACU  Anesthesia Type:General  Level of Consciousness: awake, alert , oriented and patient cooperative  Airway & Oxygen Therapy: Patient Spontanous Breathing and Patient connected to nasal cannula oxygen  Post-op Assessment: Report given to RN, Post -op Vital signs reviewed and stable and Patient moving all extremities  Post vital signs: Reviewed and stable  Last Vitals:  Filed Vitals:   07/12/15 0936 07/12/15 1029  BP: 121/66   Pulse: 80   Temp: 36.6 C 36.4 C  Resp: 16     Last Pain: There were no vitals filed for this visit.       Complications: No apparent anesthesia complications

## 2015-07-12 NOTE — Anesthesia Preprocedure Evaluation (Signed)
Anesthesia Evaluation  Patient identified by MRN, date of birth, ID band Patient awake    Reviewed: Allergy & Precautions, H&P , NPO status , Patient's Chart, lab work & pertinent test results, reviewed documented beta blocker date and time   History of Anesthesia Complications Negative for: history of anesthetic complications  Airway Mallampati: III  TM Distance: >3 FB Neck ROM: full    Dental no notable dental hx. (+) Caps   Pulmonary neg pulmonary ROS,    Pulmonary exam normal breath sounds clear to auscultation       Cardiovascular Exercise Tolerance: Good negative cardio ROS Normal cardiovascular exam Rhythm:regular Rate:Normal     Neuro/Psych PSYCHIATRIC DISORDERS (Depression and anxiety) negative neurological ROS     GI/Hepatic negative GI ROS, Neg liver ROS,   Endo/Other  negative endocrine ROS  Renal/GU negative Renal ROS  negative genitourinary   Musculoskeletal   Abdominal   Peds  Hematology  (+) Blood dyscrasia, anemia ,   Anesthesia Other Findings Past Medical History:   Anemia                                                       Allergy                                                        Comment:seasonal   Hyperlipidemia                                               Fibroids                                                     HSV (herpes simplex virus) anogenital infection              Allergic rhinitis                                            Reproductive/Obstetrics negative OB ROS                             Anesthesia Physical Anesthesia Plan  ASA: II  Anesthesia Plan: General   Post-op Pain Management:    Induction:   Airway Management Planned:   Additional Equipment:   Intra-op Plan:   Post-operative Plan:   Informed Consent: I have reviewed the patients History and Physical, chart, labs and discussed the procedure including the risks, benefits  and alternatives for the proposed anesthesia with the patient or authorized representative who has indicated his/her understanding and acceptance.   Dental Advisory Given  Plan Discussed with: Anesthesiologist, CRNA and Surgeon  Anesthesia Plan Comments:         Anesthesia Quick Evaluation

## 2015-07-13 ENCOUNTER — Encounter: Payer: Self-pay | Admitting: Unknown Physician Specialty

## 2015-10-30 ENCOUNTER — Ambulatory Visit (INDEPENDENT_AMBULATORY_CARE_PROVIDER_SITE_OTHER): Payer: BLUE CROSS/BLUE SHIELD | Admitting: Gynecology

## 2015-10-30 ENCOUNTER — Encounter: Payer: Self-pay | Admitting: Gynecology

## 2015-10-30 VITALS — BP 118/76 | Ht 65.0 in | Wt 182.0 lb

## 2015-10-30 DIAGNOSIS — N926 Irregular menstruation, unspecified: Secondary | ICD-10-CM

## 2015-10-30 DIAGNOSIS — Z01419 Encounter for gynecological examination (general) (routine) without abnormal findings: Secondary | ICD-10-CM | POA: Diagnosis not present

## 2015-10-30 NOTE — Patient Instructions (Signed)
Have your primary doctor check a [place] level (follicle-stimulating hormone) at your next blood draw.  You may obtain a copy of any labs that were done today by logging onto MyChart as outlined in the instructions provided with your AVS (after visit summary). The office will not call with normal lab results but certainly if there are any significant abnormalities then we will contact you.   Health Maintenance Adopting a healthy lifestyle and getting preventive care can go a long way to promote health and wellness. Talk with your health care provider about what schedule of regular examinations is right for you. This is a good chance for you to check in with your provider about disease prevention and staying healthy. In between checkups, there are plenty of things you can do on your own. Experts have done a lot of research about which lifestyle changes and preventive measures are most likely to keep you healthy. Ask your health care provider for more information. WEIGHT AND DIET  Eat a healthy diet  Be sure to include plenty of vegetables, fruits, low-fat dairy products, and lean protein.  Do not eat a lot of foods high in solid fats, added sugars, or salt.  Get regular exercise. This is one of the most important things you can do for your health.  Most adults should exercise for at least 150 minutes each week. The exercise should increase your heart rate and make you sweat (moderate-intensity exercise).  Most adults should also do strengthening exercises at least twice a week. This is in addition to the moderate-intensity exercise.  Maintain a healthy weight  Body mass index (BMI) is a measurement that can be used to identify possible weight problems. It estimates body fat based on height and weight. Your health care provider can help determine your BMI and help you achieve or maintain a healthy weight.  For females 56 years of age and older:   A BMI below 18.5 is considered underweight.  A  BMI of 18.5 to 24.9 is normal.  A BMI of 25 to 29.9 is considered overweight.  A BMI of 30 and above is considered obese.  Watch levels of cholesterol and blood lipids  You should start having your blood tested for lipids and cholesterol at 52 years of age, then have this test every 5 years.  You may need to have your cholesterol levels checked more often if:  Your lipid or cholesterol levels are high.  You are older than 53 years of age.  You are at high risk for heart disease.  CANCER SCREENING   Lung Cancer  Lung cancer screening is recommended for adults 11-39 years old who are at high risk for lung cancer because of a history of smoking.  A yearly low-dose CT scan of the lungs is recommended for people who:  Currently smoke.  Have quit within the past 15 years.  Have at least a 30-pack-year history of smoking. A pack year is smoking an average of one pack of cigarettes a day for 1 year.  Yearly screening should continue until it has been 15 years since you quit.  Yearly screening should stop if you develop a health problem that would prevent you from having lung cancer treatment.  Breast Cancer  Practice breast self-awareness. This means understanding how your breasts normally appear and feel.  It also means doing regular breast self-exams. Let your health care provider know about any changes, no matter how small.  If you are in your 24s or  25s, you should have a clinical breast exam (CBE) by a health care provider every 1-3 years as part of a regular health exam.  If you are 18 or older, have a CBE every year. Also consider having a breast X-ray (mammogram) every year.  If you have a family history of breast cancer, talk to your health care provider about genetic screening.  If you are at high risk for breast cancer, talk to your health care provider about having an MRI and a mammogram every year.  Breast cancer gene (BRCA) assessment is recommended for women  who have family members with BRCA-related cancers. BRCA-related cancers include:  Breast.  Ovarian.  Tubal.  Peritoneal cancers.  Results of the assessment will determine the need for genetic counseling and BRCA1 and BRCA2 testing. Cervical Cancer Routine pelvic examinations to screen for cervical cancer are no longer recommended for nonpregnant women who are considered low risk for cancer of the pelvic organs (ovaries, uterus, and vagina) and who do not have symptoms. A pelvic examination may be necessary if you have symptoms including those associated with pelvic infections. Ask your health care provider if a screening pelvic exam is right for you.   The Pap test is the screening test for cervical cancer for women who are considered at risk.  If you had a hysterectomy for a problem that was not cancer or a condition that could lead to cancer, then you no longer need Pap tests.  If you are older than 65 years, and you have had normal Pap tests for the past 10 years, you no longer need to have Pap tests.  If you have had past treatment for cervical cancer or a condition that could lead to cancer, you need Pap tests and screening for cancer for at least 20 years after your treatment.  If you no longer get a Pap test, assess your risk factors if they change (such as having a new sexual partner). This can affect whether you should start being screened again.  Some women have medical problems that increase their chance of getting cervical cancer. If this is the case for you, your health care provider may recommend more frequent screening and Pap tests.  The human papillomavirus (HPV) test is another test that may be used for cervical cancer screening. The HPV test looks for the virus that can cause cell changes in the cervix. The cells collected during the Pap test can be tested for HPV.  The HPV test can be used to screen women 72 years of age and older. Getting tested for HPV can extend the  interval between normal Pap tests from three to five years.  An HPV test also should be used to screen women of any age who have unclear Pap test results.  After 52 years of age, women should have HPV testing as often as Pap tests.  Colorectal Cancer  This type of cancer can be detected and often prevented.  Routine colorectal cancer screening usually begins at 52 years of age and continues through 52 years of age.  Your health care provider may recommend screening at an earlier age if you have risk factors for colon cancer.  Your health care provider may also recommend using home test kits to check for hidden blood in the stool.  A small camera at the end of a tube can be used to examine your colon directly (sigmoidoscopy or colonoscopy). This is done to check for the earliest forms of colorectal cancer.  Routine screening usually begins at age 70.  Direct examination of the colon should be repeated every 5-10 years through 52 years of age. However, you may need to be screened more often if early forms of precancerous polyps or small growths are found. Skin Cancer  Check your skin from head to toe regularly.  Tell your health care provider about any new moles or changes in moles, especially if there is a change in a mole's shape or color.  Also tell your health care provider if you have a mole that is larger than the size of a pencil eraser.  Always use sunscreen. Apply sunscreen liberally and repeatedly throughout the day.  Protect yourself by wearing long sleeves, pants, a wide-brimmed hat, and sunglasses whenever you are outside. HEART DISEASE, DIABETES, AND HIGH BLOOD PRESSURE   Have your blood pressure checked at least every 1-2 years. High blood pressure causes heart disease and increases the risk of stroke.  If you are between 80 years and 2 years old, ask your health care provider if you should take aspirin to prevent strokes.  Have regular diabetes screenings. This  involves taking a blood sample to check your fasting blood sugar level.  If you are at a normal weight and have a low risk for diabetes, have this test once every three years after 52 years of age.  If you are overweight and have a high risk for diabetes, consider being tested at a younger age or more often. PREVENTING INFECTION  Hepatitis B  If you have a higher risk for hepatitis B, you should be screened for this virus. You are considered at high risk for hepatitis B if:  You were born in a country where hepatitis B is common. Ask your health care provider which countries are considered high risk.  Your parents were born in a high-risk country, and you have not been immunized against hepatitis B (hepatitis B vaccine).  You have HIV or AIDS.  You use needles to inject street drugs.  You live with someone who has hepatitis B.  You have had sex with someone who has hepatitis B.  You get hemodialysis treatment.  You take certain medicines for conditions, including cancer, organ transplantation, and autoimmune conditions. Hepatitis C  Blood testing is recommended for:  Everyone born from [birth year] through 1967.  Anyone with known risk factors for hepatitis C. Sexually transmitted infections (STIs)  You should be screened for sexually transmitted infections (STIs) including gonorrhea and chlamydia if:  You are sexually active and are younger than 52 years of age.  You are older than 52 years of age and your health care provider tells you that you are at risk for this type of infection.  Your sexual activity has changed since you were last screened and you are at an increased risk for chlamydia or gonorrhea. Ask your health care provider if you are at risk.  If you do not have HIV, but are at risk, it may be recommended that you take a prescription medicine daily to prevent HIV infection. This is called pre-exposure prophylaxis (PrEP). You are considered at risk if:  You are  sexually active and do not regularly use condoms or know the HIV status of your partner(s).  You take drugs by injection.  You are sexually active with a partner who has HIV. Talk with your health care provider about whether you are at high risk of being infected with HIV. If you choose to begin PrEP, you should first  be tested for HIV. You should then be tested every 3 months for as long as you are taking PrEP.  PREGNANCY   If you are premenopausal and you may become pregnant, ask your health care provider about preconception counseling.  If you may become pregnant, take 400 to 800 micrograms (mcg) of folic acid every day.  If you want to prevent pregnancy, talk to your health care provider about birth control (contraception). OSTEOPOROSIS AND MENOPAUSE   Osteoporosis is a disease in which the bones lose minerals and strength with aging. This can result in serious bone fractures. Your risk for osteoporosis can be identified using a bone density scan.  If you are 60 years of age or older, or if you are at risk for osteoporosis and fractures, ask your health care provider if you should be screened.  Ask your health care provider whether you should take a calcium or vitamin D supplement to lower your risk for osteoporosis.  Menopause may have certain physical symptoms and risks.  Hormone replacement therapy may reduce some of these symptoms and risks. Talk to your health care provider about whether hormone replacement therapy is right for you.  HOME CARE INSTRUCTIONS   Schedule regular health, dental, and eye exams.  Stay current with your immunizations.   Do not use any tobacco products including cigarettes, chewing tobacco, or electronic cigarettes.  If you are pregnant, do not drink alcohol.  If you are breastfeeding, limit how much and how often you drink alcohol.  Limit alcohol intake to no more than 1 drink per day for nonpregnant women. One drink equals 12 ounces of beer, 5  ounces of wine, or 1 ounces of hard liquor.  Do not use street drugs.  Do not share needles.  Ask your health care provider for help if you need support or information about quitting drugs.  Tell your health care provider if you often feel depressed.  Tell your health care provider if you have ever been abused or do not feel safe at home. Document Released: 08/06/2010 Document Revised: 06/07/2013 Document Reviewed: 12/23/2012 [facility] Patient Information 2015 [place], [place]. This information is not intended to replace advice given to you by your health care provider. Make sure you discuss any questions you have with your health care provider.

## 2015-10-30 NOTE — Progress Notes (Signed)
    Holly Wolfe 1965-07-07 [id number]        50 y.o.  G0P0000  for annual exam.  Several issues noted below.  Past medical history,surgical history, problem list, medications, allergies, family history and social history were all reviewed and documented as reviewed in the EPIC chart.  ROS:  Performed with pertinent positives and negatives included in the history, assessment and plan.   Additional significant findings :  None   Exam: [name] assistant Vitals:   10/30/15 1050  BP: 118/76  Weight: 182 lb (82.6 kg)  Height: 5\' 5"  (1.651 m)   Body mass index is 30.29 kg/m.  General appearance:  Normal affect, orientation and appearance. Skin: Grossly normal HEENT: Without gross lesions.  No cervical or supraclavicular adenopathy. Thyroid normal.  Lungs:  Clear without wheezing, rales or rhonchi Cardiac: RR, without RMG Abdominal:  Soft, nontender, without masses, guarding, rebound, organomegaly or hernia Breasts:  Examined lying and sitting without masses, retractions, discharge or axillary adenopathy. Pelvic:  Ext, BUS, Vagina normal  Cervix normal  Uterus anteverted, normal size, shape and contour, midline and mobile nontender   Adnexa without masses or tenderness    Anus and perineum normal   Rectovaginal normal sphincter tone without palpated masses or tenderness.    Assessment/Plan:  50 y.o. G0P0000 female for annual exam with mildly irregular menses vasectomy birth control.   1. Mild irregular menses. Basically having menses once a month. Did skip in June with follow up regular light menses in July and August. Evaluation last year with ultrasound showing and endometrial biopsy showing proliferative endometrium. No significant hot flushes or night sweats. No prolonged bleeding. No issues with vaginal dryness. Options for management reviewed to include expectant management, hormonal manipulation such as intermittent progesterone only or low-dose oral contraceptives. I  reviewed risks to include thrombosis. She's never smoked and not being followed for significant medical issues. At this point the patient prefers to monitor and as long she has light regular or skips will follow. If she has prolonged or atypical bleeding she'll follow up for further evaluation. I did recommend getting an [place] level now is a predictive factor as far as when to expect her to transition menopause. She's going to have this done at her primary physician's office when she has regular blood done. 2. Mammography 05/2014. Need to schedule mammography reviewed. SBE monthly reviewed. 3. Pap smear/HPV 10/2013 negative. No Pap smear done today. No history of significant abnormal Pap smears. Plan repeat Pap smear approaching 5 year interval. 4. Colonoscopy 2017. Repeat at their recommended interval. 5. Health maintenance. No routine lab work done as patient does this elsewhere. Follow up in one year, sooner if significant irregular bleeding.  10 minutes of my time in excess of her routine gynecologic exam was spent in direct face to face counseling and coordination of care in regards to her problems of irregular bleeding.    Anastasio Auerbach MD, 11:20 AM 10/30/2015

## 2015-11-28 ENCOUNTER — Encounter: Payer: Self-pay | Admitting: Gynecology

## 2015-12-19 ENCOUNTER — Encounter: Payer: Self-pay | Admitting: Sports Medicine

## 2015-12-19 ENCOUNTER — Ambulatory Visit: Payer: Self-pay

## 2015-12-19 ENCOUNTER — Ambulatory Visit (INDEPENDENT_AMBULATORY_CARE_PROVIDER_SITE_OTHER): Payer: BLUE CROSS/BLUE SHIELD | Admitting: Sports Medicine

## 2015-12-19 VITALS — BP 104/51 | Ht 64.0 in | Wt 180.0 lb

## 2015-12-19 DIAGNOSIS — M7712 Lateral epicondylitis, left elbow: Secondary | ICD-10-CM | POA: Insufficient documentation

## 2015-12-19 DIAGNOSIS — M25522 Pain in left elbow: Secondary | ICD-10-CM

## 2015-12-19 MED ORDER — NITROGLYCERIN 0.2 MG/HR TD PT24
MEDICATED_PATCH | TRANSDERMAL | 1 refills | Status: DC
Start: 2015-12-19 — End: 2016-07-04

## 2015-12-19 NOTE — Assessment & Plan Note (Signed)
We will try NTG protocol Avoid stretching and strength work Begin with easy motion Avoid all but normal activity on left   Recheck in 1 month  The next options would be barbotage if not responding Surgery if that did not work

## 2015-12-19 NOTE — Patient Instructions (Signed)
Easy motion exercises  Nitroglycerin Protocol   Apply 1/4 nitroglycerin patch to affected area daily.  Change position of patch within the affected area every 24 hours.  You may experience a headache during the first 1-2 weeks of using the patch, these should subside.  If you experience headaches after beginning nitroglycerin patch treatment, you may take your preferred over the counter pain reliever.  Another side effect of the nitroglycerin patch is skin irritation or rash related to patch adhesive.  Please notify our office if you develop more severe headaches or rash, and stop the patch.  Tendon healing with nitroglycerin patch may require 12 to 24 weeks depending on the extent of injury.  Men should not use if taking Viagra, Cialis, or Levitra.   Do not use if you have migraines or rosacea.    You have a  calciferic tendinopathy in the elbow

## 2015-12-19 NOTE — Progress Notes (Signed)
Left elbow Pain  Pain in left elbow for probably 2 years Maybe occurred after moving and lifting boxes Non dominant arm  Now uses it most with cleaning work Outdoor activities  Treated by her primary with normal care Icing CSI - 1 year ago PT  Biofreeze  None of this has helped elbow Pain is still severe at times and hurts to use arm for most activity  Past hx: Takes lipitor for HDL No DM  Soc Hx; caregiver of older parents Never a smoker  ROS Opening doors painful Carrying groceries painful Making bed painful  No neck pain No radicular sxs to arm Some tingling to hand at times  Exam Pleasant F in NAD BP (!) 104/51   Ht 5\' 4"  (1.626 m)   Wt 180 lb (81.6 kg)   BMI 30.90 kg/m   Left Elbow with full ROM Painful at full extension or flexion Markedly TTP lateral epicondyle Pain with supination Pain with wrist extension No swelling or redness  ULtrasound Examination of Left Lateral Elbow  Lateral epicondyle reveals multiple calcifications throughout extensor tendon near insertion There are 5 areas of spurring that arise from the epicondyle No tear in tendon noted Marked increase in doppler activity Multiple neo-vessels No tendon tear but hypoechoic change Comparison view of RT lateral epicondyle shows that RT is normal  Summary:  Severe Calcific tendinopathy of lateral epicondyle left elbow  Ultrasound and interpretation by Stefanie Libel, MD

## 2015-12-25 ENCOUNTER — Other Ambulatory Visit: Payer: Self-pay | Admitting: *Deleted

## 2015-12-25 MED ORDER — MELOXICAM 15 MG PO TABS: ORAL_TABLET | ORAL | 2 refills | Status: DC

## 2016-01-18 ENCOUNTER — Ambulatory Visit (INDEPENDENT_AMBULATORY_CARE_PROVIDER_SITE_OTHER): Payer: BLUE CROSS/BLUE SHIELD | Admitting: Sports Medicine

## 2016-01-18 ENCOUNTER — Ambulatory Visit: Payer: Self-pay

## 2016-01-18 ENCOUNTER — Encounter: Payer: Self-pay | Admitting: Sports Medicine

## 2016-01-18 VITALS — BP 101/56 | Ht 64.0 in | Wt 180.0 lb

## 2016-01-18 DIAGNOSIS — M7712 Lateral epicondylitis, left elbow: Secondary | ICD-10-CM

## 2016-01-18 NOTE — Progress Notes (Signed)
   Subjective:    Patient ID: Holly Wolfe, female    DOB: February 06, 1965, 50 y.o.   MRN: [id number]  HPI  Patient presents for follow up of L elbow pain.   L elbow pain Patient last seen on 11/14 and diagnosed with severe calcific tendinopathy of lateral epicondyle at that time. She was told to rest her elbow and begin using nitroglycerin patches daily.  Patient reports significant improvement in pain today. Reports pain is a 3 as compared to a 10 at last visit. She has been wearing one-quarter NTG patch for 12-14 hours daily. She typically develops a headache around this time, and thus wears only one-eighth patch overnight. She has been trying to use her arm less, but still has to use it some when cleaning, dressing, and cooking. Denies any more tingling in her hand.   Review of Systems Denies tingling, numbness in L arm or hand. Endorses HA when wearing NTG patches for extended periods of time.     Objective:   Physical Exam  Constitutional: She is oriented to person, place, and time. She appears well-developed and well-nourished. No distress.  HENT:  Head: Normocephalic and atraumatic.  Pulmonary/Chest: Effort normal. No respiratory distress.  Musculoskeletal:  L elbow:  No obvious bony malformations or asymmetry on inspection. No swelling, ecchymosis, or erythema noted.  Full ROM Tenderness to palpation of lateral epicondyle, though improved from prior Pain near lateral epicondyle with full extension or flexion Pain with wrist extension Good strength of both elbow and hand though unable to perform book test  Neurological: She is alert and oriented to person, place, and time.  Psychiatric: She has a normal mood and affect. Her behavior is normal.   Ultrasound of Left Lateral Epicondyle Compared to 1 month ago There continues to be significant calcification in the left lateral epicondyle There is some spurring The increased Doppler activity  Is now return to normal There is less  hypoechoic change  Impression: significant improvement in Doppler flow in patient with calcific lateral epicondylitis  Ultrasound and interpretation by Wolfgang Phoenix. Fields, MD     Assessment & Plan:  Lateral epicondylitis of left elbow Significantly improved since last visit. [place] with persistent calcifications, however appears generally improved with significantly fewer neo-vessels. Strength improved on physical exam, however unable to perform book test, so will begin strengthening with small weight.  - Begin wrist exercises (up and down, rolling) with 0.5-1 lb weights. Also begin squeezing soft ball in affected hand.  - Continues 1/4 NTG patch daily. Can decrease size of patch if develops HA - F/u in two months   Adin Hector, MD, MPH PGY-2 [facility] Pager [phone]  I observed and examined the patient with the resident and agree with assessment and plan.  Note reviewed and modified by me. Stefanie Libel, MD

## 2016-01-18 NOTE — Assessment & Plan Note (Addendum)
Significantly improved since last visit. [place] with persistent calcifications, however appears generally improved with significantly fewer neo-vessels. Strength improved on physical exam, however unable to perform book test, so will begin strengthening with small weight.  - Begin wrist exercises (up and down, rolling) with 0.5-1 lb weights. Also begin squeezing soft ball in affected hand.  - Continues 1/4 NTG patch daily. Can decrease size of patch if develops HA - F/u in two months

## 2016-03-21 ENCOUNTER — Ambulatory Visit (INDEPENDENT_AMBULATORY_CARE_PROVIDER_SITE_OTHER): Payer: BLUE CROSS/BLUE SHIELD | Admitting: Sports Medicine

## 2016-03-21 ENCOUNTER — Encounter: Payer: Self-pay | Admitting: Sports Medicine

## 2016-03-21 VITALS — BP 95/75

## 2016-03-21 DIAGNOSIS — M7712 Lateral epicondylitis, left elbow: Secondary | ICD-10-CM | POA: Diagnosis not present

## 2016-03-21 NOTE — Progress Notes (Signed)
   Subjective:    Patient ID: Holly Wolfe, female    DOB: 11-11-1965, 51 y.o.   MRN: [id number]  HPI  Patient presents for follow up of L elbow pain.   L elbow pain Patient last seen on 12/14 for severe calcific tendinopathy of lateral epicondyle. Ultrasound at last visit slightly improved and patient reports improvement in symptoms as well.  However, today [name] reports that her pain is no better and in fact worse from her last visit. She is using the 1/4 patch of nitroglycerin, which she changes every morning. She was taking meloxicam every day, but was having stomach issues and didn't want to take omeprazole every day as well so she could take the meloxicam.   She feels like the patch isn't helping at all. She was getting minimal relief from the meloxicam, but it wasn't worth the side effect profile. She has pain all day, every day, doing normal household activities. For example, pain with making bed, blow drying hair, doing dishes, walking dog.  Review of Systems Denies tingling, numbness in L arm or hand. Endorses HA when wearing NTG patches for extended periods of time., but these are no worse.    Objective:   Physical Exam  Constitutional: She is oriented to person, place, and time. She appears well-developed and well-nourished. No distress.  Cardiovascular: Intact distal pulses.   Musculoskeletal:  L elbow:  No obvious bony malformations or asymmetry on inspection. No swelling, ecchymosis, or erythema noted.  ROM limited at full flexion and extension due to pain.  Tenderness to palpation of lateral epicondyle. Pain with wrist extension and with full supination. R elbow: normal ROM, non-tender  Neurological: She is alert and oriented to person, place, and time.  Psychiatric: She has a normal mood and affect. Her behavior is normal.   No imaging done today.     Assessment & Plan:  No problem-specific Assessment & Plan notes found for this encounter.  1. Lateral  epicondylitis of left elbow - overall worsening today than last visit. Last visit U/S with significant improvement in Doppler flow in patient with calcific lateral epicondylitis and clinically she was doing better. Did not repeat U/S today - continue nitroglycerin patch, 1/4 daily - discussed oral medications with patient, she did not want to try po medications at this time due to side effects - continue ROM exercises, without weights or resistance - start using arnica gel and apply to the affected area to see if helps. - will schedule return visit in 1 month with plan to ultrasound and inject around calcifications with saline to start the reabsorption process, unless the more conservative measures have helped. Discussed that this has a 90% success rate, but if she does not see improvement, surgery may be recommended  Patient seen and discussed with Dr. Oneida Alar, sports medicine physician.  Freddrick March, MD [facility], PGY-3 03/21/2016  11:21 AM  I observed and examined the patient with the resident and agree with assessment and plan.  Note reviewed and modified by me. Holly Libel, MD

## 2016-03-21 NOTE — Assessment & Plan Note (Signed)
With lack of improvement will modify treatment plan  Cont NTG protocol  RTC 1 month and try barbotage of calcifications

## 2016-03-21 NOTE — Patient Instructions (Signed)
1. Wrist exercises, 15 sets, three times a day. Move wrist to extension and flexion, palm and to palm down, and grip squeezes. 2. Try ARNICA gel, which you can buy at stores like [employer] or likely at pharmacies like [facility]. 3. Continue the nitroglyerin patch. 4. We will see you back in 1 month to see how you are doing with the nitroglycerin patch and arnican gel. If you are still seeing no improvement, we will do an ultrasound and inject the calcifications with saline to help start the process of breaking down.

## 2016-04-23 ENCOUNTER — Ambulatory Visit (INDEPENDENT_AMBULATORY_CARE_PROVIDER_SITE_OTHER): Payer: BLUE CROSS/BLUE SHIELD | Admitting: Sports Medicine

## 2016-04-23 ENCOUNTER — Ambulatory Visit: Payer: Self-pay

## 2016-04-23 ENCOUNTER — Encounter: Payer: Self-pay | Admitting: Sports Medicine

## 2016-04-23 VITALS — BP 120/70 | Ht 64.0 in | Wt 180.0 lb

## 2016-04-23 DIAGNOSIS — M7712 Lateral epicondylitis, left elbow: Secondary | ICD-10-CM

## 2016-04-23 MED ORDER — TRIAMCINOLONE ACETONIDE 10 MG/ML IJ SUSP
10.0000 mg | Freq: Once | INTRAMUSCULAR | Status: AC
  Administered 2016-04-23: 10 mg via INTRA_ARTICULAR

## 2016-04-23 NOTE — Progress Notes (Signed)
   Subjective:    Patient ID: Holly Wolfe, female    DOB: 28-May-1965, 51 y.o.   MRN: [id number]   CC: left elbow pain/ Calcific lateral epicondylitis on [place]  HPI: 51 y/o F with left lateral epicondylitis presents for continued pain  Left lateral epicondylitis - when last seen was started on meloxicam and nitroglycerin therapy - she has not been to tolerate the nitroglycerin as it causes headaches and meloxican made her nausea - she has been able to use aleve which has been helpful  Pertinent past medical history: left lateral epicondylitis, anxiety  Review of Systems No weakness in hand grip No neck sxs  No radicular sxs   Objective:  BP 120/70   Ht 5\' 4"  (1.626 m)   Wt 180 lb (81.6 kg)   BMI 30.90 kg/m  Vitals and nursing note reviewed  General: NAD MSK: Some TTP of the left lateral epicondyle Pain with supination and wrist extension Normal ROM of the elbow No effusion or swelling  Ultrasound of Left Elbow Bedside US showed multiple calcifications near the insertion of the extensor tendon.  Three is hypoechoic change surrounding calcifications No tears in tendon  US guided Kenalog 10 mg - 1cc and 5 cc saline soltuion injected into lateral epicondyle with direct injection to the areas of calcifications  Impression: Calcific extensor tendinopathy of lateral epicondyle left elbow  Ultrasound and interpretation by Wolfgang Phoenix. Fields, MD  Procedure:  Injection of left lateral epicondyle Consent obtained and verified. Time-out conducted. Noted no overlying erythema, induration, or other signs of local infection. Skin prepped in a sterile fashion. Topical analgesic spray: Ethyl chloride. Then we used 2 cc Lidocaine 1% to lateral epicondyle Then [place] directed injection to barbotage the calcfications Completed without difficulty. Meds: 1cc of Kenalog 10 and 5 ccs Saline Pain immediately improved suggesting accurate placement of the medication. Advised to call if  fevers/chills, erythema, induration, drainage, or persistent bleeding.  Scan demonstrated direct placement of injections into calcium deposits.  Ila Mcgill, MD        Assessment & Plan:    Lateral epicondylitis of left elbow Saline/kenalog infused via US guidance into the lateral aspect of the elbow.  - patient to rest the elbow for the next 3-4 days - mobic PRN pain - then resume arm exercises - follow up in 6 weeks    Nazifa Trinka A. Lincoln Brigham MD, [facility] Resident PGY-3 Pager [phone] I observed and examined the patient with the resident and agree with assessment and plan.  Note reviewed and modified by me. Stefanie Libel, MD

## 2016-04-23 NOTE — Assessment & Plan Note (Addendum)
Saline/kenalog infused via US guidance into the lateral aspect of the elbow.  - patient to rest the elbow for the next 3-4 days - mobic PRN pain - then resume arm exercises - follow up in 6 weeks  I think chance of success is good;  May need repeat in 3 mos.

## 2016-04-23 NOTE — Patient Instructions (Addendum)
  Rest your left elbow for the next 48-72 hours Then resume your exercises You may your Meloxicam as needed for pain

## 2016-06-04 ENCOUNTER — Encounter: Payer: Self-pay | Admitting: Sports Medicine

## 2016-06-04 ENCOUNTER — Ambulatory Visit: Payer: Self-pay

## 2016-06-04 ENCOUNTER — Ambulatory Visit (INDEPENDENT_AMBULATORY_CARE_PROVIDER_SITE_OTHER): Payer: BLUE CROSS/BLUE SHIELD | Admitting: Sports Medicine

## 2016-06-04 VITALS — BP 87/47 | Ht 64.0 in | Wt 180.0 lb

## 2016-06-04 DIAGNOSIS — M7712 Lateral epicondylitis, left elbow: Secondary | ICD-10-CM | POA: Diagnosis not present

## 2016-06-04 NOTE — Assessment & Plan Note (Addendum)
s/p barbotage with saline/kenalog infusion with US guidance with significant improvement.  Repeat US today with significant improvement in calcifications. - stop exercise/weight bearing (such as push ups or planks) with L arm given new onset of pain but cont. HEP  - continue NSAIDs PRN  - pt to f/u in 6-8wks for rescan.

## 2016-06-04 NOTE — Progress Notes (Signed)
    Subjective: CC: lateral epicondylitis  HPI: Patient is a 51 y.o. female with a past medical history of  L lateral epicondylitis presenting to clinic today for follow up. She had a Hx of 2 years of elbow pain when we started Tx in Nov. 2017  - when she was last seen she had barbotage of calcific lateral epicondylitis with saline/kenalog infused via US guidance.  - she noted complete resolution of her pain until she started boot camp.  - last week started boot camp/kick boxing on Monday and started noting pain this week. She had to take 2 Advils due to pain yesterday with complete resolution of pain. Pain is certainly not as severe as previously.  - she's still doing stretches at home. - not using Mobic - she has not been to tolerate the nitroglycerin as it causes headaches and meloxican made her nausea  ROS: No weakness in hand grip No neck sxs  No radicular sxs All other systems reviewed and are negative.   Social History: non smoker    Past Medical History Patient Active Problem List   Diagnosis Date Noted  . Lateral epicondylitis of left elbow 12/19/2015  . Uterine fibroid 10/04/2014  . Abnormal uterine bleeding 09/27/2014  . Anxiety and depression 09/02/2014  . Insomnia 09/02/2014  . Decreased libido 09/02/2014  . Headache 09/02/2014  . Allergic rhinitis 08/24/2013  . Absolute anemia 08/24/2013  . HLD (hyperlipidemia) 08/24/2013    Medications- reviewed and updated  Objective: Office vital signs reviewed. BP (!) 87/47   Ht 5\' 4"  (1.626 m)   Wt 180 lb (81.6 kg)   BMI 30.90 kg/m    Physical Examination:  General: Awake, alert, well- nourished, NAD Left elbow: faint ecchymoses noted over lateral epicondyle; very mild tenderness of lateral epicondyl. No pain with supination or extension.  Sensation intact. Normal grip strength. Brisk capillary refill.  Passed book test  Ultrasound of left lateral epicondyle  Dramatic reduction of calcifications with only:  small amount of calcification noted on ultrasound of lateral epicondyle. Doppler with minimal blood flow to the region. Her marked increased vascularity and neo-vessels have resolved Tendon appears intact with only mild increase in hypoechoic change  Impression:  Healing calcific lateral epicondylitis with marked improvement.  Ultrasound and interpretation by Wolfgang Phoenix. Fields, MD   Assessment/Plan: Lateral epicondylitis of left elbow s/p saline/kenalog infusion with US guidance with significant improvement. Repeat US today with significant improvement in calcifications. - stop exercise/weight bearing (such as push ups or planks) with L arm given new onset of pain  - continue NSAIDs PRN - pt to f/u in 6-8wks for rescan.    Orders Placed This Encounter  Procedures  . [place] LIMITED JOINT SPACE STRUCTURES UP LEFT    Standing Status:   Future    Number of Occurrences:   1    Standing Expiration Date:   08/03/2017    Order Specific Question:   Reason for Exam (SYMPTOM  OR DIAGNOSIS REQUIRED)    Answer:   LEFT ELBOW PAIN    Order Specific Question:   Preferred imaging location?    Answer:   Internal    No orders of the defined types were placed in this encounter.   Archie Patten PGY-3, [place]

## 2016-07-04 ENCOUNTER — Ambulatory Visit (INDEPENDENT_AMBULATORY_CARE_PROVIDER_SITE_OTHER): Payer: BLUE CROSS/BLUE SHIELD | Admitting: Gynecology

## 2016-07-04 ENCOUNTER — Encounter: Payer: Self-pay | Admitting: Gynecology

## 2016-07-04 VITALS — BP 122/76

## 2016-07-04 DIAGNOSIS — N951 Menopausal and female climacteric states: Secondary | ICD-10-CM | POA: Diagnosis not present

## 2016-07-04 DIAGNOSIS — N926 Irregular menstruation, unspecified: Secondary | ICD-10-CM

## 2016-07-04 LAB — TSH: TSH: 2.28 mIU/L

## 2016-07-04 NOTE — Patient Instructions (Signed)
Follow up for ultrasound as scheduled 

## 2016-07-04 NOTE — Progress Notes (Signed)
    Holly Wolfe 02-23-1965 [id number]        51 y.o.  G0P0000 presents complaining of worsening night sweats and mood swings over the past year. Also some hot flushes during the day. Menses continue monthly although over the past year has noticed brown staining throughout the month consistently each month. Some bright red staining but mostly brown. No irritation, itching, UTI symptoms. Vasectomy birth control. Has had irregular menses in the past with D&C 2014 but did not seem to help as far as irregularity. Had ultrasound and follow up endometrial biopsy 2016 which were negative excepting small 21 mm myoma. Started her regular menses yesterday  Past medical history,surgical history, problem list, medications, allergies, family history and social history were all reviewed and documented in the EPIC chart.  Directed ROS with pertinent positives and negatives documented in the history of present illness/assessment and plan.  Exam: Holly Wolfe assistant Vitals:   07/04/16 1009  BP: 122/76   General appearance:  Normal Abdomen soft nontender without masses guarding rebound Pelvic external BUS vagina with light menses flow. Cervix grossly normal. Uterus anteverted normal size midline mobile nontender. Adnexa without masses or tenderness.  Assessment/Plan:  51 y.o. G0P0000 with symptoms consistent with perimenopausal changes. Does have daily brown spotting between her menses. Discussed potential for endometrial pathology to include polyps or hyperplasia. Recommended starting with a sonohysterogram to clear the endometrium and baseline labs to include Deputy and TSH due to her menopausal symptoms. Possibilities treatment reviewed to include low-dose oral contraceptives to assure menstrual regulation or low-dose estrogen replacement such as patch to control her symptoms and as long as she continues to spontaneously monthly withdrawal the monitor. The issues of progesterone replacement if less frequent  shedding reviewed. Issues of estrogen replacement to include risks such as thrombosis discussed. Will follow up for her lab results and ultrasound results and then we will go from there.   Greater than 50% of my time was spent in direct face to face counseling and coordination of care with the patient.   Anastasio Auerbach MD, 10:31 AM 07/04/2016

## 2016-07-05 LAB — FOLLICLE STIMULATING HORMONE: FSH: 21.7 m[IU]/mL

## 2016-07-08 ENCOUNTER — Telehealth: Payer: Self-pay

## 2016-07-08 NOTE — Telephone Encounter (Signed)
Thyroid normal. [place] beginning into the early menopausal range. I was going to discuss this when she comes in for her sonohysterogram which she should have scheduled.

## 2016-07-08 NOTE — Telephone Encounter (Signed)
She saw you 07/04/16 for visit and had hormone levels (FSH,TSH) checked. What to tell her about results?

## 2016-07-08 NOTE — Telephone Encounter (Signed)
Detailed message left in voice mail per [facility] access note on file. She does have Ray scheduled for 07/24/16.

## 2016-07-09 ENCOUNTER — Encounter: Payer: Self-pay | Admitting: *Deleted

## 2016-07-09 NOTE — Progress Notes (Signed)
Pt was informed by Dr Phineas Real the necessity of the [facility] and it is recommended for full evaluation. Pt was informed of approximate cost of SH[facility] - due to her deductible it will be ~$1100 and pt stated could not afford that. Per Dr Phineas Real, we reiterated the necessity and recommendation of SHGM but starting with the endometrial biopsy that will evaluate the lining and follow up with full SHGM at a later time.  Pt agreed and will schedule that. KW CMA

## 2016-07-10 ENCOUNTER — Telehealth: Payer: Self-pay | Admitting: *Deleted

## 2016-07-10 ENCOUNTER — Other Ambulatory Visit: Payer: Self-pay | Admitting: Gynecology

## 2016-07-10 DIAGNOSIS — N939 Abnormal uterine and vaginal bleeding, unspecified: Secondary | ICD-10-CM

## 2016-07-10 NOTE — Telephone Encounter (Signed)
Pt called and left questions in triage voicemail about [facility] appointment, regarding pain, if bleeding occurs. I left a detailed message explained discomfort maybe expected, light bleeding after maybe expected as well.

## 2016-07-24 ENCOUNTER — Ambulatory Visit: Payer: BLUE CROSS/BLUE SHIELD | Admitting: Gynecology

## 2016-07-24 ENCOUNTER — Other Ambulatory Visit: Payer: BLUE CROSS/BLUE SHIELD

## 2016-07-30 ENCOUNTER — Ambulatory Visit: Payer: Self-pay

## 2016-07-30 ENCOUNTER — Ambulatory Visit (INDEPENDENT_AMBULATORY_CARE_PROVIDER_SITE_OTHER): Payer: BLUE CROSS/BLUE SHIELD | Admitting: Sports Medicine

## 2016-07-30 ENCOUNTER — Encounter: Payer: Self-pay | Admitting: Sports Medicine

## 2016-07-30 VITALS — BP 100/68 | Ht 63.0 in | Wt 180.0 lb

## 2016-07-30 DIAGNOSIS — M7712 Lateral epicondylitis, left elbow: Secondary | ICD-10-CM | POA: Diagnosis not present

## 2016-07-30 NOTE — Progress Notes (Signed)
  Holly Wolfe - 51 y.o. female MRN [id number]  Date of birth: 1965-09-29  SUBJECTIVE:  Including CC & ROS.   Holly Wolfe is a 51 yo F that is following up for left epicondylitis. She has been dealing with this for over 2 years. She feels about 50% improved. She has barbotage performed on her LE about 2 months ago. She feels the pain when she is lifting a pot or a pan. She couldn't tolerate NTG patches. She was doing [employer] and felt this exacerbated her symptoms.   ROS: No unexpected weight loss, fever, chills, swelling, instability, muscle pain, numbness/tingling, redness, otherwise see HPI   HISTORY: Past Medical, Surgical, Social, and Family History Reviewed & Updated per EMR.   Pertinent Historical Findings include: PMHx: HLD, anxiety and depression  Social:  No tobacco use    DATA REVIEWED: Previous ultrasounds   PHYSICAL EXAM:  VS: BP 100/68   Ht 5\' 3"  (1.6 m)   Wt 180 lb (81.6 kg)   LMP 07/03/2016   BMI 31.89 kg/m  PHYSICAL EXAM: Gen: NAD, alert, cooperative with exam, well-appearing HEENT: clear conjunctiva, EOMI CV:  no edema, capillary refill brisk,  Resp: non-labored, normal speech Skin: no rashes, normal turgor  Neuro: no gross deficits.  Psych:  alert and oriented MSK:  Left elbow:  Some TTP of the LE  No overlying ecchymosis or swelling  Some TTP of the midbelly of the ECRB Pain with resisted supination  Neurovascularly intact.   Limited ultrasound: left elbow:  Calcific tendinitis is still present but improved.  Vascular uptake near LE  No partial tears or abnormal signal of supinator    Summary: ongoing calcific LE tendinopathy     Ultrasound and interpretation by Clearance Coots, MD   ASSESSMENT & PLAN:   Lateral epicondylitis of left elbow Slowly and surely this seems to be improving. She has dealt with this for about two years.  - can increase resistance with exercises  - f/u in 2 months to re scan

## 2016-08-01 ENCOUNTER — Other Ambulatory Visit: Payer: BLUE CROSS/BLUE SHIELD | Admitting: Sports Medicine

## 2016-08-01 NOTE — Assessment & Plan Note (Signed)
Slowly and surely this seems to be improving. She has dealt with this for about two years.  - can increase resistance with exercises  - f/u in 2 months to re scan

## 2016-09-02 ENCOUNTER — Ambulatory Visit (INDEPENDENT_AMBULATORY_CARE_PROVIDER_SITE_OTHER): Payer: BLUE CROSS/BLUE SHIELD

## 2016-09-02 ENCOUNTER — Ambulatory Visit (INDEPENDENT_AMBULATORY_CARE_PROVIDER_SITE_OTHER): Payer: BLUE CROSS/BLUE SHIELD | Admitting: Gynecology

## 2016-09-02 ENCOUNTER — Other Ambulatory Visit: Payer: Self-pay | Admitting: Gynecology

## 2016-09-02 DIAGNOSIS — D251 Intramural leiomyoma of uterus: Secondary | ICD-10-CM

## 2016-09-02 DIAGNOSIS — N939 Abnormal uterine and vaginal bleeding, unspecified: Secondary | ICD-10-CM

## 2016-09-02 DIAGNOSIS — N926 Irregular menstruation, unspecified: Secondary | ICD-10-CM

## 2016-09-02 DIAGNOSIS — N841 Polyp of cervix uteri: Secondary | ICD-10-CM

## 2016-09-02 NOTE — Progress Notes (Addendum)
    Billy Rocco Dec 08, 1965 [id number]        51 y.o.  G0P0000 presents for sonohysterogram. Patient notes over the past year her menses have been relatively regular but has had some bleeding on and off. She's had persistent brown staining each month. She has had an ultrasound July 2016 which showed a complex area in the cervix measuring 3.6 x 2.8 x 3.6 cm. Questionable fluid collection or complex nabothian cyst. Endometrial biopsy was performed which showed proliferative endometrium.  She also has a history of D&C 2014 due to irregular bleeding.  Past medical history,surgical history, problem list, medications, allergies, family history and social history were all reviewed and documented in the EPIC chart.  Directed ROS with pertinent positives and negatives documented in the history of present illness/assessment and plan.  Exam: [place] assistant There were no vitals filed for this visit. General appearance:  Normal Abdomen soft nontender without masses guarding rebound Pelvic external BUS vagina normal. Cervix normal. Uterus anteverted normal size midline mobile nontender. Adnexa without masses or tenderness.  Ultrasound shows uterus grossly normal size. Several leiomyoma noted 28 mm, 18 mm. Cortical cystic area noted 11 x 14 mm intramural. Endometrial echo 4.0 mm. Cervical canal shows echogenic focus 20 mm x 13 mm. Right and left ovaries grossly normal with physiologic changes. Cul-de-sac negative.    Sonohysterogram performed, sterile technique, easy catheter introduction, good distention with no endometrial defects noted. Endometrial biopsy taken. Patient noted to have fair amount of cramping afterwards and some lightheadedness. Husband remained with her and states this was not unusual for her. Vital signs remained stable. Patient ultimately was discharged home doing well with her husband.    Assessment/Plan:  57 y.o. G0P0000 with history of irregular perimenopausal bleeding. Recent  FSH 21. Endocervix with evidence of generous polyp. Question whether this is related to her irregular bleeding or whether this is more perimenopausal. Recommended we start with hysteroscopy D&C with resection of the endocervical polyp and then monitor her cycles after that. Possible hormonal manipulation afterwards discussed. Patient will follow up for biopsy results in several days. She will follow up to schedule the hysteroscopy D&C as she agrees to do so. She will follow up before surgery for a preoperative visit.    Anastasio Auerbach MD, 12:29 PM 09/02/2016

## 2016-09-02 NOTE — Patient Instructions (Signed)
Office will call you with the biopsy results from the ultrasound Office will call you to arrange the D&C.

## 2016-09-03 ENCOUNTER — Telehealth: Payer: Self-pay

## 2016-09-03 ENCOUNTER — Encounter: Payer: Self-pay | Admitting: Gynecology

## 2016-09-03 MED ORDER — MISOPROSTOL 200 MCG PO TABS: ORAL_TABLET | ORAL | 0 refills | Status: DC

## 2016-09-03 NOTE — Telephone Encounter (Signed)
I'm glad to hear the patient is feeling better. To answer her questions:  #1 the polyp has nothing to do with the menopause.  Her hormone level check was borderline and it is impossible to predict exactly when she will go through menopause although it looks like she is moving in that direction. Unfortunately she could still have periods over the next year or 2. #2 the D&C should remove the polyp completely. It could be possible that this was causing discomfort with intercourse if it was putting pressure on the cervix. #3 urinary incontinence with laughing, coughing, sneezing or jumping is unfortunately a common occurrence as patients age. It has to do with the bladder not having as good of support. Kegel exercises done regularly may help with this. She can googled this and it will give her instructions on how to do this. Otherwise surgery is usually the next step and we can refer her to the urologist if she would like to have that discussion. #4 we will be scheduling her surgery

## 2016-09-03 NOTE — Telephone Encounter (Signed)
I called patient and spoke with her about scheduling surgery.  Discussed her ins benefits with her and her estimated surgery prepymt. Financial letter to be sent to her.  Surgery scheduled 09/19/16 at 7:30am at [facility]. I advised patient regarding need for Cytotec tab intravaginally hs before surgery and Rx was sent. [facility] pamphlet also will be sent to patient.

## 2016-09-04 ENCOUNTER — Encounter: Payer: Self-pay | Admitting: Gynecology

## 2016-09-05 ENCOUNTER — Encounter: Payer: Self-pay | Admitting: Gynecology

## 2016-09-06 ENCOUNTER — Encounter (HOSPITAL_BASED_OUTPATIENT_CLINIC_OR_DEPARTMENT_OTHER): Payer: Self-pay | Admitting: *Deleted

## 2016-09-06 NOTE — Progress Notes (Signed)
NPO AFTER MN.  ARRIVE AT 0600.  GETTING LAB WORK DONE PRIOR TO DOS.  WILL TAKE CLARITIN AM DOS W/ SIPS OF WATER.

## 2016-09-10 DIAGNOSIS — N840 Polyp of corpus uteri: Secondary | ICD-10-CM | POA: Diagnosis present

## 2016-09-10 DIAGNOSIS — D259 Leiomyoma of uterus, unspecified: Secondary | ICD-10-CM | POA: Diagnosis not present

## 2016-09-10 DIAGNOSIS — K219 Gastro-esophageal reflux disease without esophagitis: Secondary | ICD-10-CM | POA: Diagnosis not present

## 2016-09-10 DIAGNOSIS — N926 Irregular menstruation, unspecified: Secondary | ICD-10-CM | POA: Diagnosis not present

## 2016-09-10 LAB — COMPREHENSIVE METABOLIC PANEL
ALK PHOS: 63 U/L (ref 38–126)
ALT: 30 U/L (ref 14–54)
AST: 30 U/L (ref 15–41)
Albumin: 4.4 g/dL (ref 3.5–5.0)
Anion gap: 9 (ref 5–15)
BILIRUBIN TOTAL: 0.7 mg/dL (ref 0.3–1.2)
BUN: 12 mg/dL (ref 6–20)
CALCIUM: 9.4 mg/dL (ref 8.9–10.3)
CO2: 30 mmol/L (ref 22–32)
CREATININE: 0.74 mg/dL (ref 0.44–1.00)
Chloride: 103 mmol/L (ref 101–111)
Glucose, Bld: 120 mg/dL — ABNORMAL HIGH (ref 65–99)
Potassium: 3.7 mmol/L (ref 3.5–5.1)
Sodium: 142 mmol/L (ref 135–145)
TOTAL PROTEIN: 7.3 g/dL (ref 6.5–8.1)

## 2016-09-10 LAB — CBC
HEMATOCRIT: 41.6 % (ref 36.0–46.0)
HEMOGLOBIN: 14.1 g/dL (ref 12.0–15.0)
MCH: 31.5 pg (ref 26.0–34.0)
MCHC: 33.9 g/dL (ref 30.0–36.0)
MCV: 92.9 fL (ref 78.0–100.0)
PLATELETS: 288 10*3/uL (ref 150–400)
RBC: 4.48 MIL/uL (ref 3.87–5.11)
RDW: 13.3 % (ref 11.5–15.5)
WBC: 7.1 10*3/uL (ref 4.0–10.5)

## 2016-09-13 ENCOUNTER — Ambulatory Visit (INDEPENDENT_AMBULATORY_CARE_PROVIDER_SITE_OTHER): Payer: BLUE CROSS/BLUE SHIELD | Admitting: Gynecology

## 2016-09-13 ENCOUNTER — Encounter: Payer: Self-pay | Admitting: Gynecology

## 2016-09-13 VITALS — BP 120/78

## 2016-09-13 DIAGNOSIS — N841 Polyp of cervix uteri: Secondary | ICD-10-CM | POA: Diagnosis not present

## 2016-09-13 DIAGNOSIS — N926 Irregular menstruation, unspecified: Secondary | ICD-10-CM

## 2016-09-13 NOTE — H&P (Signed)
Holly Wolfe 1965/11/04 [id number]   History and Physical  Chief complaint: Irregular bleeding, endocervical polyp  History of present illness: 51 y.o. G0P0000 with history of fairly regular monthly menses with onset of intermenstrual spotting and light bleeding over the last several months. Recent FSH 21. Ultrasound shows several small myomas 28 mm and 18 mm. Endometrial echo 4.0 mm. Cervical canal with outlined echogenic focus 20 mm x 13 mm. Right and left ovaries normal. Cul-de-sac negative. Sonohysterogram was performed that showed no endometrial defects. Endometrial biopsy showed atrophic endometrium. Pap smear/HPV negative 2015. Patient is admitted for hysteroscopy D&C with resection of the endocervical mass.  Past medical history,surgical history, medications, allergies, family history and social history were all reviewed and documented in the EPIC chart.  ROS:  Was performed and pertinent positives and negatives are included in the history of present illness.  Exam: Vitals:   09/13/16 0928  BP: 120/78   General: well developed, well nourished female, no acute distress HEENT: normal  Lungs: clear to auscultation without wheezing, rales or rhonchi  Cardiac: regular rate without rubs, murmurs or gallops  Abdomen: soft, nontender without masses, guarding, rebound, organomegaly  09/02/2016 Pelvic: external bus vagina: normal   Cervix: grossly normal  Uterus: normal size, midline and mobile, nontender  Adnexa: without masses or tenderness     Assessment/Plan:  51 y.o. G0P0000 with new onset intermenstrual staining and light bleeding with patient also noting some discomfort with intercourse that she wonders is not due to this endocervical polyp. I reviewed the situation with the patient and my recommendation to proceed with hysteroscopy D&C to resect this endocervical mass. Certainly may be the cause of her intermenstrual bleeding. Unclear if it would cause discomfort with  intercourse. I reviewed with the patient that there are no guarantees that the procedure will relieve her bleeding or discomfort with intercourse. I also reviewed with her that I may or may not be able to see this defect on hysteroscopy if it is within the cervical stroma but it does appear to be outlined with cervical fluid/mucus which certainly implies that is within the endocervical canal.  I reviewed the proposed surgery with the patient to include the expected intraoperative and postoperative courses as well as the recovery period. The use of the hysteroscope, resectoscope and the D&C portion were all discussed. The risks of surgery to include infection, prolonged antibiotics, hemorrhage necessitating transfusion and the risks of transfusion, including transfusion reaction, hepatitis, HIV, mad cow disease and other unknown entities were all discussed understood and accepted. The risk of damage to internal organs during the procedure, either immediately recognized or delay recognized, including vagina, cervix, uterus, possible perforation causing damage to bowel, bladder, ureters, vessels and nerves necessitating major exploratory reparative surgery and future reparative surgeries including bladder repair, ureteral damage repair, bowel resection, ostomy formation was also discussed understood and accepted. The patient's questions were answered to her satisfaction and she is ready to proceed with surgery.    Anastasio Auerbach MD, 10:09 AM 09/13/2016

## 2016-09-13 NOTE — Patient Instructions (Signed)
Followup for surgery as scheduled. 

## 2016-09-13 NOTE — Progress Notes (Signed)
Holly Wolfe [birth year] [id number]   Preoperative consult  Chief complaint: Irregular bleeding, endocervical polyp  History of present illness: 51 y.o. G0P0000 with history of fairly regular monthly menses with onset of intermenstrual spotting and light bleeding over the last several months. Recent FSH 21. Ultrasound shows several small myomas 28 mm and 18 mm. Endometrial echo 4.0 mm. Cervical canal with outlined echogenic focus 20 mm x 13 mm. Right and left ovaries normal. Cul-de-sac negative. Sonohysterogram was performed that showed no endometrial defects. Endometrial biopsy showed atrophic endometrium. Pap smear/HPV negative 2015. Patient is admitted for hysteroscopy D&C with resection of the endocervical mass.  Past medical history,surgical history, medications, allergies, family history and social history were all reviewed and documented in the EPIC chart.  ROS:  Was performed and pertinent positives and negatives are included in the history of present illness.  Exam: Vitals:   09/13/16 0928  BP: 120/78   General: well developed, well nourished female, no acute distress HEENT: normal  Lungs: clear to auscultation without wheezing, rales or rhonchi  Cardiac: regular rate without rubs, murmurs or gallops  Abdomen: soft, nontender without masses, guarding, rebound, organomegaly  09/02/2016 Pelvic: external bus vagina: normal   Cervix: grossly normal  Uterus: normal size, midline and mobile, nontender  Adnexa: without masses or tenderness     Assessment/Plan:  51 y.o. G0P0000 with new onset intermenstrual staining and light bleeding with patient also noting some discomfort with intercourse that she wonders is not due to this endocervical polyp. I reviewed the situation with the patient and my recommendation to proceed with hysteroscopy D&C to resect this endocervical mass. Certainly may be the cause of her intermenstrual bleeding. Unclear if it would cause discomfort with  intercourse. I reviewed with the patient that there are no guarantees that the procedure will relieve her bleeding or discomfort with intercourse. I also reviewed with her that I may or may not be able to see this defect on hysteroscopy if it is within the cervical stroma but it does appear to be outlined with cervical fluid/mucus which certainly implies that is within the endocervical canal.  I reviewed the proposed surgery with the patient to include the expected intraoperative and postoperative courses as well as the recovery period. The use of the hysteroscope, resectoscope and the D&C portion were all discussed. The risks of surgery to include infection, prolonged antibiotics, hemorrhage necessitating transfusion and the risks of transfusion, including transfusion reaction, hepatitis, HIV, mad cow disease and other unknown entities were all discussed understood and accepted. The risk of damage to internal organs during the procedure, either immediately recognized or delay recognized, including vagina, cervix, uterus, possible perforation causing damage to bowel, bladder, ureters, vessels and nerves necessitating major exploratory reparative surgery and future reparative surgeries including bladder repair, ureteral damage repair, bowel resection, ostomy formation was also discussed understood and accepted. The patient's questions were answered to her satisfaction and she is ready to proceed with surgery.    Anastasio Auerbach MD, 9:59 AM 09/13/2016

## 2016-09-18 NOTE — Anesthesia Preprocedure Evaluation (Addendum)
Anesthesia Evaluation  Patient identified by MRN, date of birth, ID band Patient awake    Reviewed: Allergy & Precautions, NPO status , Patient's Chart, lab work & pertinent test results  Airway Mallampati: III  TM Distance: <3 FB Neck ROM: Full    Dental  (+) Dental Advisory Given   Pulmonary neg pulmonary ROS,    breath sounds clear to auscultation       Cardiovascular negative cardio ROS   Rhythm:Regular Rate:Normal     Neuro/Psych negative neurological ROS     GI/Hepatic Neg liver ROS, GERD  ,  Endo/Other  negative endocrine ROS  Renal/GU negative Renal ROS     Musculoskeletal   Abdominal   Peds  Hematology negative hematology ROS (+)   Anesthesia Other Findings   Reproductive/Obstetrics                            Lab Results  Component Value Date   WBC 7.1 09/10/2016   HGB 14.1 09/10/2016   HCT 41.6 09/10/2016   MCV 92.9 09/10/2016   PLT 288 09/10/2016   Lab Results  Component Value Date   CREATININE 0.74 09/10/2016   BUN 12 09/10/2016   NA 142 09/10/2016   K 3.7 09/10/2016   CL 103 09/10/2016   CO2 30 09/10/2016    Anesthesia Physical Anesthesia Plan  ASA: II  Anesthesia Plan: General   Post-op Pain Management:    Induction: Intravenous  PONV Risk Score and Plan: 4 or greater and Ondansetron, Dexamethasone, Midazolam, Scopolamine patch - Pre-op and Treatment may vary due to age or medical condition  Airway Management Planned: Oral ETT and Video Laryngoscope Planned  Additional Equipment:   Intra-op Plan:   Post-operative Plan: Extubation in OR  Informed Consent: I have reviewed the patients History and Physical, chart, labs and discussed the procedure including the risks, benefits and alternatives for the proposed anesthesia with the patient or authorized representative who has indicated his/her understanding and acceptance.   Dental advisory  given  Plan Discussed with: CRNA  Anesthesia Plan Comments:        Anesthesia Quick Evaluation

## 2016-09-19 ENCOUNTER — Encounter (HOSPITAL_BASED_OUTPATIENT_CLINIC_OR_DEPARTMENT_OTHER): Payer: Self-pay | Admitting: *Deleted

## 2016-09-19 ENCOUNTER — Ambulatory Visit (HOSPITAL_BASED_OUTPATIENT_CLINIC_OR_DEPARTMENT_OTHER): Payer: BLUE CROSS/BLUE SHIELD | Admitting: Anesthesiology

## 2016-09-19 ENCOUNTER — Ambulatory Visit (HOSPITAL_BASED_OUTPATIENT_CLINIC_OR_DEPARTMENT_OTHER)
Admission: RE | Admit: 2016-09-19 | Discharge: 2016-09-19 | Disposition: A | Discharge status: Home / Self Care | Payer: BLUE CROSS/BLUE SHIELD | Source: Ambulatory Visit | Attending: Gynecology | Admitting: Gynecology

## 2016-09-19 ENCOUNTER — Encounter (HOSPITAL_BASED_OUTPATIENT_CLINIC_OR_DEPARTMENT_OTHER)
Admission: RE | Disposition: A | Discharge status: Home / Self Care | Payer: Self-pay | Source: Ambulatory Visit | Attending: Gynecology

## 2016-09-19 DIAGNOSIS — N926 Irregular menstruation, unspecified: Secondary | ICD-10-CM | POA: Diagnosis not present

## 2016-09-19 DIAGNOSIS — N840 Polyp of corpus uteri: Secondary | ICD-10-CM | POA: Diagnosis not present

## 2016-09-19 DIAGNOSIS — D259 Leiomyoma of uterus, unspecified: Secondary | ICD-10-CM | POA: Insufficient documentation

## 2016-09-19 DIAGNOSIS — K219 Gastro-esophageal reflux disease without esophagitis: Secondary | ICD-10-CM | POA: Insufficient documentation

## 2016-09-19 DIAGNOSIS — N841 Polyp of cervix uteri: Secondary | ICD-10-CM | POA: Diagnosis not present

## 2016-09-19 HISTORY — DX: Excessive bleeding in the premenopausal period: N92.4

## 2016-09-19 HISTORY — DX: Other seasonal allergic rhinitis: J30.2

## 2016-09-19 HISTORY — DX: Leiomyoma of uterus, unspecified: D25.9

## 2016-09-19 HISTORY — DX: Polyp of cervix uteri: N84.1

## 2016-09-19 HISTORY — DX: Residual hemorrhoidal skin tags: K64.8

## 2016-09-19 HISTORY — PX: DILATATION & CURETTAGE/HYSTEROSCOPY WITH MYOSURE: SHX6511

## 2016-09-19 HISTORY — DX: Iron deficiency anemia, unspecified: D50.9

## 2016-09-19 HISTORY — DX: Presence of spectacles and contact lenses: Z97.3

## 2016-09-19 HISTORY — DX: Residual hemorrhoidal skin tags: K64.4

## 2016-09-19 HISTORY — DX: Gastro-esophageal reflux disease without esophagitis: K21.9

## 2016-09-19 SURGERY — DILATATION & CURETTAGE/HYSTEROSCOPY WITH MYOSURE
Anesthesia: General

## 2016-09-19 MED ORDER — MIDAZOLAM HCL 5 MG/5ML IJ SOLN
INTRAMUSCULAR | Status: DC | PRN
  Administered 2016-09-19 (×2): 1 mg via INTRAVENOUS

## 2016-09-19 MED ORDER — SUCCINYLCHOLINE CHLORIDE 200 MG/10ML IV SOSY
PREFILLED_SYRINGE | INTRAVENOUS | Status: AC
  Filled 2016-09-19: qty 10

## 2016-09-19 MED ORDER — DEXAMETHASONE SODIUM PHOSPHATE 4 MG/ML IJ SOLN
INTRAMUSCULAR | Status: DC | PRN
  Administered 2016-09-19: 10 mg via INTRAVENOUS

## 2016-09-19 MED ORDER — EPHEDRINE SULFATE 50 MG/ML IJ SOLN
INTRAMUSCULAR | Status: DC | PRN
  Administered 2016-09-19: 10 mg via INTRAVENOUS

## 2016-09-19 MED ORDER — DEXAMETHASONE SODIUM PHOSPHATE 10 MG/ML IJ SOLN
INTRAMUSCULAR | Status: AC
  Filled 2016-09-19: qty 1

## 2016-09-19 MED ORDER — SUCCINYLCHOLINE CHLORIDE 200 MG/10ML IV SOSY
PREFILLED_SYRINGE | INTRAVENOUS | Status: DC | PRN
  Administered 2016-09-19: 120 mg via INTRAVENOUS

## 2016-09-19 MED ORDER — SUGAMMADEX SODIUM 200 MG/2ML IV SOLN
INTRAVENOUS | Status: AC
  Filled 2016-09-19: qty 2

## 2016-09-19 MED ORDER — SUGAMMADEX SODIUM 200 MG/2ML IV SOLN
INTRAVENOUS | Status: DC | PRN
  Administered 2016-09-19: 200 mg via INTRAVENOUS

## 2016-09-19 MED ORDER — MIDAZOLAM HCL 2 MG/2ML IJ SOLN
INTRAMUSCULAR | Status: AC
  Filled 2016-09-19: qty 2

## 2016-09-19 MED ORDER — CEFOTETAN DISODIUM-DEXTROSE 2-2.08 GM-% IV SOLR
INTRAVENOUS | Status: AC
  Filled 2016-09-19: qty 50

## 2016-09-19 MED ORDER — KETOROLAC TROMETHAMINE 30 MG/ML IJ SOLN
INTRAMUSCULAR | Status: DC | PRN
  Administered 2016-09-19: 30 mg via INTRAVENOUS

## 2016-09-19 MED ORDER — FENTANYL CITRATE (PF) 100 MCG/2ML IJ SOLN
INTRAMUSCULAR | Status: DC | PRN
  Administered 2016-09-19 (×2): 25 ug via INTRAVENOUS
  Administered 2016-09-19: 50 ug via INTRAVENOUS

## 2016-09-19 MED ORDER — FENTANYL CITRATE (PF) 100 MCG/2ML IJ SOLN
INTRAMUSCULAR | Status: AC
  Filled 2016-09-19: qty 2

## 2016-09-19 MED ORDER — HYDROCODONE-ACETAMINOPHEN 7.5-325 MG PO TABS
1.0000 | ORAL_TABLET | Freq: Once | ORAL | Status: DC | PRN
  Filled 2016-09-19: qty 1

## 2016-09-19 MED ORDER — WHITE PETROLATUM GEL
Status: AC
  Filled 2016-09-19: qty 5

## 2016-09-19 MED ORDER — PROPOFOL 10 MG/ML IV BOLUS
INTRAVENOUS | Status: AC
  Filled 2016-09-19: qty 20

## 2016-09-19 MED ORDER — OXYCODONE-ACETAMINOPHEN 5-325 MG PO TABS
ORAL_TABLET | ORAL | Status: AC
  Filled 2016-09-19: qty 1

## 2016-09-19 MED ORDER — ONDANSETRON HCL 4 MG/2ML IJ SOLN
INTRAMUSCULAR | Status: AC
  Filled 2016-09-19: qty 2

## 2016-09-19 MED ORDER — KETOROLAC TROMETHAMINE 30 MG/ML IJ SOLN
30.0000 mg | Freq: Once | INTRAMUSCULAR | Status: DC | PRN
  Filled 2016-09-19: qty 1

## 2016-09-19 MED ORDER — EPHEDRINE 5 MG/ML INJ
INTRAVENOUS | Status: AC
  Filled 2016-09-19: qty 10

## 2016-09-19 MED ORDER — LACTATED RINGERS IV SOLN
INTRAVENOUS | Status: DC
  Administered 2016-09-19 (×2): via INTRAVENOUS
  Filled 2016-09-19: qty 1000

## 2016-09-19 MED ORDER — OXYCODONE-ACETAMINOPHEN 5-325 MG PO TABS
1.0000 | ORAL_TABLET | Freq: Four times a day (QID) | ORAL | Status: DC | PRN
  Administered 2016-09-19: 1 via ORAL
  Filled 2016-09-19: qty 1

## 2016-09-19 MED ORDER — OXYCODONE-ACETAMINOPHEN 5-325 MG PO TABS: 1.0000 | ORAL_TABLET | Freq: Four times a day (QID) | ORAL | 0 refills | Status: DC | PRN

## 2016-09-19 MED ORDER — KETOROLAC TROMETHAMINE 30 MG/ML IJ SOLN
INTRAMUSCULAR | Status: AC
  Filled 2016-09-19: qty 1

## 2016-09-19 MED ORDER — LIDOCAINE 2% (20 MG/ML) 5 ML SYRINGE
INTRAMUSCULAR | Status: AC
  Filled 2016-09-19: qty 5

## 2016-09-19 MED ORDER — PROMETHAZINE HCL 25 MG/ML IJ SOLN
INTRAMUSCULAR | Status: AC
  Filled 2016-09-19: qty 1

## 2016-09-19 MED ORDER — PROMETHAZINE HCL 25 MG/ML IJ SOLN
6.2500 mg | INTRAMUSCULAR | Status: DC | PRN
  Administered 2016-09-19: 6.25 mg via INTRAVENOUS
  Filled 2016-09-19: qty 1

## 2016-09-19 MED ORDER — SCOPOLAMINE 1 MG/3DAYS TD PT72
MEDICATED_PATCH | TRANSDERMAL | Status: AC
  Filled 2016-09-19: qty 1

## 2016-09-19 MED ORDER — ONDANSETRON HCL 4 MG/2ML IJ SOLN
INTRAMUSCULAR | Status: DC | PRN
  Administered 2016-09-19: 4 mg via INTRAVENOUS

## 2016-09-19 MED ORDER — LIDOCAINE 2% (20 MG/ML) 5 ML SYRINGE
INTRAMUSCULAR | Status: DC | PRN
  Administered 2016-09-19: 80 mg via INTRAVENOUS

## 2016-09-19 MED ORDER — ROCURONIUM BROMIDE 50 MG/5ML IV SOSY
PREFILLED_SYRINGE | INTRAVENOUS | Status: AC
  Filled 2016-09-19: qty 5

## 2016-09-19 MED ORDER — FENTANYL CITRATE (PF) 100 MCG/2ML IJ SOLN
25.0000 ug | INTRAMUSCULAR | Status: DC | PRN
  Administered 2016-09-19 (×2): 50 ug via INTRAVENOUS
  Filled 2016-09-19: qty 1

## 2016-09-19 MED ORDER — CEFOTETAN DISODIUM 2 G IJ SOLR
2.0000 g | INTRAMUSCULAR | Status: AC
  Administered 2016-09-19: 2 g via INTRAVENOUS
  Filled 2016-09-19: qty 2

## 2016-09-19 MED ORDER — SCOPOLAMINE 1 MG/3DAYS TD PT72
1.0000 | MEDICATED_PATCH | TRANSDERMAL | Status: DC
  Administered 2016-09-19: 1.5 mg via TRANSDERMAL
  Filled 2016-09-19: qty 1

## 2016-09-19 MED ORDER — LIDOCAINE HCL 1 % IJ SOLN
INTRAMUSCULAR | Status: DC | PRN
  Administered 2016-09-19: 10 mL

## 2016-09-19 MED ORDER — ROCURONIUM BROMIDE 100 MG/10ML IV SOLN
INTRAVENOUS | Status: DC | PRN
  Administered 2016-09-19: 25 mg via INTRAVENOUS

## 2016-09-19 MED ORDER — PROPOFOL 10 MG/ML IV BOLUS
INTRAVENOUS | Status: DC | PRN
  Administered 2016-09-19: 180 mg via INTRAVENOUS

## 2016-09-19 SURGICAL SUPPLY — 24 items
CANISTER SUCT 3000ML PPV (MISCELLANEOUS) ×3 IMPLANT
CATH ROBINSON RED A/P 16FR (CATHETERS) ×3 IMPLANT
CLOTH BEACON ORANGE TIMEOUT ST (SAFETY) ×3 IMPLANT
COUNTER NEEDLE 1200 MAGNETIC (NEEDLE) ×3 IMPLANT
DEVICE MYOSURE LITE (MISCELLANEOUS) ×3 IMPLANT
DEVICE MYOSURE REACH (MISCELLANEOUS) IMPLANT
DILATOR CANAL MILEX (MISCELLANEOUS) IMPLANT
FILTER ARTHROSCOPY CONVERTOR (FILTER) ×3 IMPLANT
GLOVE BIO SURGEON STRL SZ7.5 (GLOVE) ×6 IMPLANT
GOWN STRL REUS W/TWL LRG LVL3 (GOWN DISPOSABLE) ×3 IMPLANT
IV NS IRRIG 3000ML ARTHROMATIC (IV SOLUTION) ×3 IMPLANT
KIT RM TURNOVER CYSTO AR (KITS) ×3 IMPLANT
MYOSURE XL FIBROID REM (MISCELLANEOUS)
NDL SAFETY ECLIPSE 18X1.5 (NEEDLE) IMPLANT
NEEDLE HYPO 18GX1.5 SHARP (NEEDLE)
PACK VAGINAL MINOR WOMEN LF (CUSTOM PROCEDURE TRAY) ×3 IMPLANT
PAD OB MATERNITY 4.3X12.25 (PERSONAL CARE ITEMS) ×3 IMPLANT
SEAL ROD LENS SCOPE MYOSURE (ABLATOR) ×3 IMPLANT
SYRINGE LUER LOK 1CC (MISCELLANEOUS) IMPLANT
SYSTEM TISS REMOVAL MYSR XL RM (MISCELLANEOUS) IMPLANT
TOWEL OR 17X24 6PK STRL BLUE (TOWEL DISPOSABLE) ×6 IMPLANT
TUBING AQUILEX INFLOW (TUBING) ×3 IMPLANT
TUBING AQUILEX OUTFLOW (TUBING) ×3 IMPLANT
WATER STERILE IRR 500ML POUR (IV SOLUTION) IMPLANT

## 2016-09-19 NOTE — Discharge Instructions (Signed)
° °  Postoperative Instructions Hysteroscopy D & C  Dr. Phineas Real and the nursing staff have discussed postoperative instructions with you.  If you have any questions please ask them before you leave the hospital, or call Dr Elisabeth Most office at [phone].    We would like to emphasize the following instructions:   ? Call the office to make your follow-up appointment as recommended by Dr Phineas Real (usually 1-2 weeks).  ? You were given a prescription, or one was ordered for you at the pharmacy you designated.  Get that prescription filled and take the medication according to instructions.  ? You may eat a regular diet, but slowly until you start having bowel movements.  ? Drink plenty of water daily.  ? Nothing in the vagina (intercourse, douching, objects of any kind) for two weeks.  When reinitiating intercourse, if it is uncomfortable, stop and make an appointment with Dr Phineas Real to be evaluated.  ? No driving for one to two days until the effects of anesthesia has worn off.  No traveling out of town for several days.  ? You may shower, but no baths for one week.  Walking up and down stairs is ok.  No heavy lifting, prolonged standing, repeated bending or any working out until your post op check.  ? Rest frequently, listen to your body and do not push yourself and overdo it.  ? Call if:  o Your pain medication does not seem strong enough. o Worsening pain or abdominal bloating o Persistent nausea or vomiting o Difficulty with urination or bowel movements. o Temperature of 101 degrees or higher. o Heavy vaginal bleeding.  If your period is due, you may use tampons. o You have any questions or concerns    Post Anesthesia Home Care Instructions  Activity: Get plenty of rest for the remainder of the day. A responsible individual must stay with you for 24 hours following the procedure.  For the next 24 hours, DO NOT: -Drive a car -Paediatric nurse -Drink alcoholic  beverages -Take any medication unless instructed by your physician -Make any legal decisions or sign important papers.  Meals: Start with liquid foods such as gelatin or soup. Progress to regular foods as tolerated. Avoid greasy, spicy, heavy foods. If nausea and/or vomiting occur, drink only clear liquids until the nausea and/or vomiting subsides. Call your physician if vomiting continues.  Special Instructions/Symptoms: Your throat may feel dry or sore from the anesthesia or the breathing tube placed in your throat during surgery. If this causes discomfort, gargle with warm salt water. The discomfort should disappear within 24 hours.  If you had a scopolamine patch placed behind your ear for the management of post- operative nausea and/or vomiting:  1. The medication in the patch is effective for 72 hours, after which it should be removed.  Wrap patch in a tissue and discard in the trash. Wash hands thoroughly with soap and water. 2. You may remove the patch earlier than 72 hours if you experience unpleasant side effects which may include dry mouth, dizziness or visual disturbances. 3. Avoid touching the patch. Wash your hands with soap and water after contact with the patch.   MAY TAKE IBUPROFEN AFTER 1:30 PM  toradol 30 mg IV given in OR at 0730

## 2016-09-19 NOTE — H&P (Signed)
The patient was examined.  I reviewed the proposed surgery and consent form with the patient and her husband.  The dictated history and physical is current and accurate and all questions were answered. The patient is ready to proceed with surgery and has a realistic understanding and expectation for the outcome.   Anastasio Auerbach MD, 7:14 AM 09/19/2016

## 2016-09-19 NOTE — Transfer of Care (Signed)
Immediate Anesthesia Transfer of Care Note  Patient: Holly Wolfe  Procedure(s) Performed: Procedure(s) (LRB): DILATATION & CURETTAGE/HYSTEROSCOPY WITH MYOSURE (N/A)  Patient Location: PACU  Anesthesia Type: General  Level of Consciousness: awake, oriented, sedated and patient cooperative  Airway & Oxygen Therapy: Patient Spontanous Breathing and Patient connected to face mask oxygen  Post-op Assessment: Report given to PACU RN and Post -op Vital signs reviewed and stable  Post vital signs: Reviewed and stable  Complications: No apparent anesthesia complications  Last Vitals:  Vitals:   09/19/16 0604 09/19/16 0826  BP: (!) 108/50 (!) 115/53  Pulse: (!) 51 81  Resp: 16 16  Temp: 36.6 C 36.9 C  SpO2: 100% 100%    Last Pain:  Vitals:   09/19/16 0640  TempSrc:   PainSc: 10-Worst pain ever      Patients Stated Pain Goal: 5 (09/19/16 0640)

## 2016-09-19 NOTE — Op Note (Signed)
[name] [birth year] [id number]   Post Operative Note   Date of surgery:  09/19/2016  Pre Op Dx:  Irregular bleeding, endocervical polyp, leiomyoma  Post Op Dx:  Irregular bleeding, endometrial polyp, leiomyoma  Procedure:  Hysteroscopy, D&C, Myosure resection endometrial polyp  Surgeon:  Donalynn Furlong P  Anesthesia:  General  EBL:  Minimal  Distended media discrepancy:  488 cc saline  Complications:  None  Specimen:  #1 endocervical curetting #2 endometrial curetting #3 endometrial polyp to pathology  Findings: EUA:  External BUS vagina with mild atrophic changes. Cervix normal. Uterus normal size midline mobile. Adnexa without masses   Hysteroscopy:  Adequate noting fundus, anterior/posterior endometrial surfaces, right/left tubal ostia, lower uterine segment, endocervical canal all visualized. Small polypoid mass lower mid posterior endometrial surface resected in its entirety. No other abnormalities were seen. The endocervical canal was meticulously visualized with multiple passes of the hysteroscope in search of the endocervical-type polyp visualized on the ultrasound preoperatively with no abnormalities seen. A vigorous endocervical curetting was performed and repeat hysteroscopy was again without abnormalities visualized.  Procedure:  The patient was taken to the operating room, was placed in the low dorsal lithotomy position, underwent general anesthesia, received a perineal/vaginal preparation with Betadine solution per nursing personnel and the bladder was emptied with in and out Foley catheterization. The timeout was performed by the surgical team. An EUA was performed. The patient was draped in the usual fashion. The cervix was visualized with a speculum, anterior lip grasped with a single-tooth tenaculum and a paracervical block was placed using 10 cc's of 1% lidocaine. The cervix was gently dilated to admit the Myosure hysteroscope and hysteroscopy was performed with  findings noted above. Using the Myosure Lite resectoscopic wand the polyp was resected in it's entirety to the level the surrounding endometrium. An endocervical curetting was then performed followed by a gentle sharp endometrial curettage and both specimens were sent to pathology separately along with the endometrial polyp resection accounting for 3 separate specimens. Repeat hysteroscopy showed an empty cavity with good distention and no evidence of perforation. The instruments were removed and adequate hemostasis was visualized at the tenaculum site and external cervical os. The instrument counts were verified correct and the specimens were identified separately for pathology. The patient was given intraoperative Toradol, was awakened without difficulty and was taken to the recovery room in good condition having tolerated the procedure well.    Anastasio Auerbach MD, 8:18 AM 09/19/2016

## 2016-09-19 NOTE — Anesthesia Procedure Notes (Signed)
Procedure Name: Intubation Date/Time: 09/19/2016 7:32 AM Performed by: Denna Haggard D Pre-anesthesia Checklist: Patient identified, Emergency Drugs available, Suction available and Patient being monitored Patient Re-evaluated:Patient Re-evaluated prior to induction Oxygen Delivery Method: Circle system utilized Preoxygenation: Pre-oxygenation with 100% oxygen Induction Type: IV induction Ventilation: Mask ventilation without difficulty Laryngoscope Size: Mac and 3 Grade View: Grade I Tube type: Oral Tube size: 7.0 mm Number of attempts: 1 Airway Equipment and Method: Stylet and Oral airway Placement Confirmation: ETT inserted through vocal cords under direct vision,  positive ETCO2 and breath sounds checked- equal and bilateral Secured at: 22 cm Tube secured with: Tape Dental Injury: Teeth and Oropharynx as per pre-operative assessment

## 2016-09-20 ENCOUNTER — Encounter (HOSPITAL_BASED_OUTPATIENT_CLINIC_OR_DEPARTMENT_OTHER): Payer: Self-pay | Admitting: Gynecology

## 2016-09-20 NOTE — Anesthesia Postprocedure Evaluation (Signed)
Anesthesia Post Note  Patient: Holly Wolfe  Procedure(s) Performed: Procedure(s) (LRB): DILATATION & CURETTAGE/HYSTEROSCOPY WITH MYOSURE (N/A)     Patient location during evaluation: PACU Anesthesia Type: General Level of consciousness: awake and alert Pain management: pain level controlled Vital Signs Assessment: post-procedure vital signs reviewed and stable Respiratory status: spontaneous breathing, nonlabored ventilation, respiratory function stable and patient connected to nasal cannula oxygen Cardiovascular status: blood pressure returned to baseline and stable Postop Assessment: no signs of nausea or vomiting Anesthetic complications: no    Last Vitals:  Vitals:   09/19/16 0930 09/19/16 1110  BP: (!) 108/46 (!) 100/53  Pulse: 69 77  Resp: 14 16  Temp:  36.9 C  SpO2: 99% 98%    Last Pain:  Vitals:   09/20/16 1105  TempSrc:   PainSc: 5                  Tiajuana Amass

## 2016-09-23 ENCOUNTER — Telehealth: Payer: Self-pay | Admitting: *Deleted

## 2016-09-23 NOTE — Telephone Encounter (Signed)
Not unusual and probably related to the anesthesia. Given there is no other concerning signs such as fevers, vomiting and increasing pain. I would continue pushing fluids and slowly start eating more and I suspect it will resolve.

## 2016-09-23 NOTE — Telephone Encounter (Signed)
Pt called wanting to follow up had D&C on 09/19/16 has been doing well until Saturday she had a lot of gas when passing she had very loose stool. Took imodium for stools, still has gas and feels very bloated, also notes lower abdominal tenderness, no pain, feels discomfort believes to be associated with the bloating, taking tylenol for discomfort, stopped oxycodone on Friday.  No fever no chills, eating small amount of food, drinking plenty of fluids, urinating fine. Pt asked if sounds normal? Also wanted to confirm okay to drive?

## 2016-09-23 NOTE — Telephone Encounter (Signed)
Pt informed

## 2016-10-01 ENCOUNTER — Encounter: Payer: Self-pay | Admitting: Gynecology

## 2016-10-01 ENCOUNTER — Ambulatory Visit (INDEPENDENT_AMBULATORY_CARE_PROVIDER_SITE_OTHER): Payer: BLUE CROSS/BLUE SHIELD | Admitting: Gynecology

## 2016-10-01 VITALS — BP 118/76

## 2016-10-01 DIAGNOSIS — Z9889 Other specified postprocedural states: Secondary | ICD-10-CM

## 2016-10-01 NOTE — Progress Notes (Signed)
    Holly Wolfe 1965-05-18 [id number]        51 y.o.  G0P0000 presents for her postoperative visit status post hysteroscopic resection of endometrial polyp. Patient has done well with no bleeding or pain following the procedure.  Past medical history,surgical history, problem list, medications, allergies, family history and social history were all reviewed and documented in the EPIC chart.  Directed ROS with pertinent positives and negatives documented in the history of present illness/assessment and plan.  Exam: [name] assistant Vitals:   10/01/16 1619  BP: 118/76   General appearance:  Normal Abdomen soft nontender without masses guarding rebound Pelvic external BUS vagina normal.  Cervix normal. Uterus normal size midline mobile nontender.  Adnexa without masses or tenderness.s  Assessment/Plan:  51 y.o. G0P000 with normal postoperative visit status post hysteroscopy D&C. I reviewed the benign pathology showing endometrial polyp and proliferative endometrium. Also reviewed pictures from the surgery. Patient has her annual exam scheduled next month and will follow up for this.    Anastasio Auerbach MD, 4:57 PM 10/01/2016

## 2016-10-01 NOTE — Patient Instructions (Signed)
Follow-up for annual exam as scheduled 

## 2016-10-31 ENCOUNTER — Encounter: Payer: BLUE CROSS/BLUE SHIELD | Admitting: Gynecology

## 2016-12-05 ENCOUNTER — Encounter: Payer: Self-pay | Admitting: Gynecology

## 2016-12-05 ENCOUNTER — Ambulatory Visit (INDEPENDENT_AMBULATORY_CARE_PROVIDER_SITE_OTHER): Payer: BLUE CROSS/BLUE SHIELD | Admitting: Gynecology

## 2016-12-05 VITALS — BP 124/80 | Ht 64.0 in | Wt 187.0 lb

## 2016-12-05 DIAGNOSIS — Z01411 Encounter for gynecological examination (general) (routine) with abnormal findings: Secondary | ICD-10-CM

## 2016-12-05 DIAGNOSIS — N952 Postmenopausal atrophic vaginitis: Secondary | ICD-10-CM

## 2016-12-05 NOTE — Progress Notes (Signed)
    Donald Jacque 02-Dec-1965 [id number]        51 y.o.  G0P0000 for annual gynecologic exam.  Doing well.  Had hysteroscopy D&C for endometrial polyp irregular bleeding in August.  She had a light menses in September no bleeding since.  Not having significant hot flushes, night sweats or other menopausal symptoms.  [place] earlier this year 65.  Past medical history,surgical history, problem list, medications, allergies, family history and social history were all reviewed and documented as reviewed in the EPIC chart.  ROS:  Performed with pertinent positives and negatives included in the history, assessment and plan.   Additional significant findings : None   Exam: [name] assistant Vitals:   12/05/16 1418  BP: 124/80  Weight: 187 lb (84.8 kg)  Height: 5\' 4"  (1.626 m)   Body mass index is 32.1 kg/m.  General appearance:  Normal affect, orientation and appearance. Skin: Grossly normal HEENT: Without gross lesions.  No cervical or supraclavicular adenopathy. Thyroid normal.  Lungs:  Clear without wheezing, rales or rhonchi Cardiac: RR, without RMG Abdominal:  Soft, nontender, without masses, guarding, rebound, organomegaly or hernia Breasts:  Examined lying and sitting without masses, retractions, discharge or axillary adenopathy. Pelvic:  Ext, BUS, Vagina: Normal with mild atrophic changes  Cervix: Normal  Uterus: Anteverted, normal size, shape and contour, midline and mobile nontender   Adnexa: Without masses or tenderness    Anus and perineum: Normal   Rectovaginal: Normal sphincter tone without palpated masses or tenderness.    Assessment/Plan:  51 y.o. G0P0000 female for annual gynecologic exam with irregular menses, vasectomy birth control.   1. Perimenopause.  Patient having less frequent menses this past year.  Recent resection of benign endometrial polyp.  Not having significant menopausal symptoms.  [place] earlier is marginal at 2.  Will keep menstrual calendar as long  as less frequent menses but normal when they occur will follow.  If prolonged or atypical bleeding or develops significant menopausal symptoms she will represent for further evaluation and discussion of treatment options. 2. Mammography due now when I reminded her to schedule this.  SBE monthly reviewed.  Breast exam normal today. 3. Pap smear/HPV 2015.  No Pap smear done today.  No history of abnormal Pap smears.  Plan repeat Pap smear at 5-year interval per current screening guidelines. 4. Colonoscopy 2017.  Repeat at their recommended interval. 5. Health maintenance.  No routine lab work done as patient does this elsewhere.  Follow-up 1 year, sooner as needed.   Anastasio Auerbach MD, 2:35 PM 12/05/2016

## 2016-12-05 NOTE — Patient Instructions (Signed)
Follow-up in 1 year for annual exam, sooner if any issues. 

## 2017-01-23 ENCOUNTER — Ambulatory Visit: Payer: Self-pay

## 2017-01-23 ENCOUNTER — Encounter: Payer: Self-pay | Admitting: Sports Medicine

## 2017-01-23 ENCOUNTER — Ambulatory Visit (INDEPENDENT_AMBULATORY_CARE_PROVIDER_SITE_OTHER): Payer: BLUE CROSS/BLUE SHIELD | Admitting: Sports Medicine

## 2017-01-23 VITALS — BP 108/80 | Ht 64.0 in | Wt 183.0 lb

## 2017-01-23 DIAGNOSIS — M79672 Pain in left foot: Secondary | ICD-10-CM | POA: Diagnosis not present

## 2017-01-23 DIAGNOSIS — M25572 Pain in left ankle and joints of left foot: Secondary | ICD-10-CM | POA: Diagnosis not present

## 2017-01-23 DIAGNOSIS — M25579 Pain in unspecified ankle and joints of unspecified foot: Secondary | ICD-10-CM | POA: Insufficient documentation

## 2017-01-23 NOTE — Assessment & Plan Note (Signed)
This is stable for walking Use ASO brace Avoid weight bearing exercise next 4 weeks other than routine walking Ice as needed  Reck 4 wks

## 2017-01-23 NOTE — Progress Notes (Signed)
CC; Left ankle pain  Patient doing kick boxing In exercise class would follow with jumping jacks Pain progressively worse this week OK to walk  Pressure on ankle painful  ROS Minimal swelling Ankle does not feel unstable  PE Pleasant F in NAD BP 108/80   Ht 5\' 4"  (1.626 m)   Wt 183 lb (83 kg)   BMI 31.41 kg/m   Ankle:Left No visible erythema Left sinus tarsi with mild swelling. Range of motion is full in all directions. Strength is 5/5 in all directions. Stable lateral and medial ligaments; squeeze test  unremarkable; Talar dome nontender; Plantar flexion with inversion causes pain Pain to palpation of anterior part of lateral malleolus  No pain at base of 5th MT; No tenderness over cuboid; No tenderness over N spot or navicular prominence No tenderness on posterior aspects of lateral and medial malleolus No sign of peroneal tendon subluxations; Negative tarsal tunnel tinel's Able to walk 4 steps.  Ultrasound of Left lateral ankle No joint effusion Talus appears normal Small bone fragment noted anterior to the cortex and just below this portion of the distal lateral  Malleolus This has hypoechoic change ATF ligament appears intact  Impression: Small bone chip noted anterior to lateral maolleolus  Ultrasound and interpretation by Wolfgang Phoenix. Oneida Alar, MD

## 2017-02-18 ENCOUNTER — Encounter: Payer: Self-pay | Admitting: Sports Medicine

## 2017-02-18 ENCOUNTER — Ambulatory Visit (INDEPENDENT_AMBULATORY_CARE_PROVIDER_SITE_OTHER): Payer: BLUE CROSS/BLUE SHIELD | Admitting: Sports Medicine

## 2017-02-18 DIAGNOSIS — M25572 Pain in left ankle and joints of left foot: Secondary | ICD-10-CM | POA: Diagnosis not present

## 2017-02-18 NOTE — Assessment & Plan Note (Signed)
Continue ASO for 2 more weeks Start 1 foot balance/ stand and reach/ 1 leg knee bend  OK to restart exercise Ice as needed  Reck prn

## 2017-02-18 NOTE — Patient Instructions (Signed)
Use ankle brace for at least 2 more weeks Then as needed  4 exercises to restore ankle function  1 foot stand / eyes closed/ 10 x 10 secs  Stand and reach - alternate arms - do 5 repeats to count of 20  Toe walking - straight/ feet out/ feet in  1 leg knee bend - down to about 30 degrees  Set of 10 and build up to 3 sets

## 2017-02-18 NOTE — Progress Notes (Signed)
F/u Left ankle chip fracture  Patient with ankle pain Found with small chip fracture on [place] Came from kickboxing Using ASO brace for past 4 weeks Has stopped activities that hurt Now able to walk and do normal activity with minimal pain Wants to restart exercise  ROS Does not feel unstable No swelling  PE Pleasant F in NAD BP 90/75   Ht 5\' 4"  (1.626 m)   Wt 183 lb (83 kg)   BMI 31.41 kg/m   Ankle: No visible erythema or swelling. Range of motion is full in all directions. Strength is 5/5 in all directions. Stable lateral and medial ligaments; squeeze test and kleiger test unremarkable; Talar dome nontender; No pain at base of 5th MT; No tenderness over cuboid; No tenderness over N spot or navicular prominence No tenderness on posterior aspects of lateral and medial malleolus No sign of peroneal tendon subluxations; Negative tarsal tunnel tinel's Able to walk 4 steps.  1 foot balance good Stand and reach good 1 leg knee bend causes mild pain

## 2017-02-25 ENCOUNTER — Ambulatory Visit: Payer: BLUE CROSS/BLUE SHIELD | Admitting: Sports Medicine

## 2017-04-29 ENCOUNTER — Ambulatory Visit (INDEPENDENT_AMBULATORY_CARE_PROVIDER_SITE_OTHER): Payer: BLUE CROSS/BLUE SHIELD | Admitting: Sports Medicine

## 2017-04-29 VITALS — BP 104/64 | Ht 64.0 in | Wt 182.0 lb

## 2017-04-29 DIAGNOSIS — M25572 Pain in left ankle and joints of left foot: Secondary | ICD-10-CM | POA: Diagnosis not present

## 2017-04-30 ENCOUNTER — Encounter: Payer: Self-pay | Admitting: Sports Medicine

## 2017-04-30 ENCOUNTER — Telehealth: Payer: Self-pay | Admitting: Sports Medicine

## 2017-04-30 ENCOUNTER — Ambulatory Visit
Admission: RE | Admit: 2017-04-30 | Discharge: 2017-04-30 | Disposition: A | Discharge status: Home / Self Care | Payer: BLUE CROSS/BLUE SHIELD | Source: Ambulatory Visit | Attending: Sports Medicine | Admitting: Sports Medicine

## 2017-04-30 DIAGNOSIS — M25572 Pain in left ankle and joints of left foot: Secondary | ICD-10-CM

## 2017-04-30 NOTE — Telephone Encounter (Signed)
  X-ray reviewed. Although the official radiology read states that there is no acute injury, I question whether or not there is an OCD along the lateral talus. This is best seen on the AP view. Patient is instructed to proceed with MRI to evaluate further. Phone follow-up with those findings when available and we will delineate a more definitive treatment plan based on those results.

## 2017-04-30 NOTE — Progress Notes (Signed)
   Subjective:    Patient ID: Holly Wolfe, female    DOB: 27-Jun-1965, 52 y.o.   MRN: [id number]  HPI   Patient comes in today with persistent left ankle pain. She was initially treated by Dr. Oneida Alar in December of this year after injuring her ankle in a kickboxing class. Ultrasound evaluation in December showed a small chip fracture in the anterior lateral ankle. She was treated with an ASO and activity limitation for about a month. At follow-up on January 15 she seemed to be doing better. She was given a home exercise program and discharged to increase activity as tolerated. Since that time her pain has plateaued. She describes an achy discomfort diffuse across the anterior ankle. She enjoys walking but has significant pain afterwards. She has not noticed any swelling. No feelings of instability. No pain at rest. No other imaging other than the ultrasound. No prior ankle problems in the past. She is here today with her husband.  Interim medical history reviewed Medications reviewed Allergies reviewed    Review of Systems As above    Objective:   Physical Exam  Overweight. No acute distress.Awake alert and oriented 3. Vital signs reviewed.  Left ankle: Patient has good range of motion. No obvious soft tissue swelling.She may have a trace effusion.Ankle is stable to anterior drawer and talar tilt testing. Deltoid ligament is stable as well. She is tender to palpation along the anterior lateral joint line. Some tenderness along the anterior medial joint line. No tenderness at the base of the fifth metatarsal. No tenderness at the navicular. Neurovascularly intact distally. Walking without significant limp.      Assessment & Plan:   Chronic left ankle pain worrisome for osteochondral defect  Patient is having pain that is limiting her activity. Symptoms all began after an injury in December. I'm going to get an x-ray of the ankle but if it is unremarkable we will need to pursue an MRI  scan specifically to rule out an osteochondral defect. Phone follow-up with those results when available. We will delineate a more definitive treatment plan based on those findings. In the meantime, I think she can discontinue her ASO when walking on level ground. However, she needs to use it when walking on uneven surfaces.

## 2017-05-02 ENCOUNTER — Other Ambulatory Visit: Payer: BLUE CROSS/BLUE SHIELD

## 2017-05-06 ENCOUNTER — Ambulatory Visit: Payer: BLUE CROSS/BLUE SHIELD | Admitting: Sports Medicine

## 2017-05-07 ENCOUNTER — Ambulatory Visit
Admission: RE | Admit: 2017-05-07 | Discharge: 2017-05-07 | Disposition: A | Discharge status: Home / Self Care | Payer: BLUE CROSS/BLUE SHIELD | Source: Ambulatory Visit | Attending: Sports Medicine | Admitting: Sports Medicine

## 2017-05-07 DIAGNOSIS — M25572 Pain in left ankle and joints of left foot: Secondary | ICD-10-CM

## 2017-05-09 ENCOUNTER — Telehealth: Payer: Self-pay | Admitting: Sports Medicine

## 2017-05-09 NOTE — Telephone Encounter (Signed)
  I spoke with the patient on the phone today after reviewing the MRI of her left ankle. There is no osteochondral defect. In fact the MRI is unremarkable other than some incidental peroneal tendinitis. Patient is reassured of these findings. I recommended that she return to the office early next week to be educated in a comprehensive ankle rehabilitation home exercise program. She may continue with activity, including recreational walking, as symptoms allow. Follow-up for ongoing or recalcitrant issues.

## 2017-05-09 NOTE — Telephone Encounter (Signed)
Opened by accident

## 2017-06-18 ENCOUNTER — Encounter: Payer: Self-pay | Admitting: Gynecology

## 2017-06-18 NOTE — Telephone Encounter (Signed)
We use 1 year without bleeding as the definition of menopause and if any bleeding after that we consider that now abnormal and needs to be evaluated.  If she is still having sporadic menses throughout the year then this is "normal" in the perimenopause.  As long as it is not prolonged/significantly atypical.  If she does have prolonged or atypical bleeding then office visit.  Otherwise if it seems like a "normal" menses then I would monitor for now.

## 2017-12-12 ENCOUNTER — Encounter: Payer: Self-pay | Admitting: Gynecology

## 2017-12-12 ENCOUNTER — Ambulatory Visit (INDEPENDENT_AMBULATORY_CARE_PROVIDER_SITE_OTHER): Payer: BLUE CROSS/BLUE SHIELD | Admitting: Gynecology

## 2017-12-12 VITALS — BP 118/78 | Ht 64.0 in | Wt 179.0 lb

## 2017-12-12 DIAGNOSIS — N951 Menopausal and female climacteric states: Secondary | ICD-10-CM

## 2017-12-12 DIAGNOSIS — Z1151 Encounter for screening for human papillomavirus (HPV): Secondary | ICD-10-CM | POA: Diagnosis not present

## 2017-12-12 DIAGNOSIS — Z01419 Encounter for gynecological examination (general) (routine) without abnormal findings: Secondary | ICD-10-CM

## 2017-12-12 NOTE — Addendum Note (Signed)
Addended by: Nelva Nay on: 12/12/2017 08:45 AM   Modules accepted: Orders

## 2017-12-12 NOTE — Progress Notes (Signed)
    Holly Wolfe [birth year] [id number]        52 y.o.  G0P0000 for annual gynecologic exam.  Without gynecologic complaints.  Continues to have menses although will skip up to 4 to 5 months.  Past medical history,surgical history, problem list, medications, allergies, family history and social history were all reviewed and documented as reviewed in the EPIC chart.  ROS:  Performed with pertinent positives and negatives included in the history, assessment and plan.   Additional significant findings : None   Exam: [name] assistant Vitals:   12/12/17 0805  BP: 118/78  Weight: 179 lb (81.2 kg)  Height: 5\' 4"  (1.626 m)   Body mass index is 30.73 kg/m.  General appearance:  Normal affect, orientation and appearance. Skin: Grossly normal HEENT: Without gross lesions.  No cervical or supraclavicular adenopathy. Thyroid normal.  Lungs:  Clear without wheezing, rales or rhonchi Cardiac: RR, without RMG Abdominal:  Soft, nontender, without masses, guarding, rebound, organomegaly or hernia Breasts:  Examined lying and sitting without masses, retractions, discharge or axillary adenopathy. Pelvic:  Ext, BUS, Vagina: Normal  Cervix: Normal.  Pap smear/HPV  Uterus: Anteverted, normal size, shape and contour, midline and mobile nontender   Adnexa: Without masses or tenderness    Anus and perineum: Normal   Rectovaginal: Normal sphincter tone without palpated masses or tenderness.    Assessment/Plan:  52 y.o. G0P0000 female for annual gynecologic exam with irregular menses, vasectomy birth control.   1. Perimenopause.  Patient continues to have irregular menses with skips up to 4 to 5 months.  No significant menopausal symptoms otherwise.  No prolonged or atypical bleeding.  We discussed what to expect in the premenopausal timeframe.  She will keep a menstrual calendar as long as regular less frequent menses will monitor.  If prolonged or atypical bleeding or goes more than 1 year  without bleeding and then bleeds she will call. 2. Mammography coming due in January and I reminded her to schedule this.  Breast exam normal today. 3. Pap smear/HPV 10/2013.  Pap smear/HPV today.  No history of abnormal Pap smears previously. 4. Colonoscopy 2017.  Repeat at their recommended interval. 5. Health maintenance.  No routine lab work done as patient has this done elsewhere.  Follow-up 1 year, sooner as needed.   Anastasio Auerbach MD, 8:27 AM 12/12/2017

## 2017-12-12 NOTE — Patient Instructions (Signed)
Follow-up in 1 year for annual exam 

## 2017-12-16 LAB — PAP IG AND HPV HIGH-RISK: HPV DNA High Risk: NOT DETECTED

## 2018-04-06 DIAGNOSIS — J329 Chronic sinusitis, unspecified: Secondary | ICD-10-CM | POA: Diagnosis not present

## 2018-08-14 ENCOUNTER — Encounter: Payer: Self-pay | Admitting: Family Medicine

## 2018-08-14 ENCOUNTER — Ambulatory Visit (INDEPENDENT_AMBULATORY_CARE_PROVIDER_SITE_OTHER): Payer: BC Managed Care – PPO | Admitting: Family Medicine

## 2018-08-14 ENCOUNTER — Ambulatory Visit: Payer: Self-pay

## 2018-08-14 ENCOUNTER — Other Ambulatory Visit: Payer: Self-pay

## 2018-08-14 VITALS — BP 102/70 | Ht 63.0 in | Wt 180.0 lb

## 2018-08-14 DIAGNOSIS — M25562 Pain in left knee: Secondary | ICD-10-CM

## 2018-08-14 NOTE — Patient Instructions (Signed)
You have a mild meniscal irritation Try the exercises, icing, activity modifications and I will see you back in 3-4 weeks. Great to meet

## 2018-08-16 DIAGNOSIS — M25562 Pain in left knee: Secondary | ICD-10-CM | POA: Insufficient documentation

## 2018-08-16 NOTE — Progress Notes (Signed)
  Holly Wolfe - 53 y.o. female MRN [id number]  Date of birth: [birth year]    SUBJECTIVE:      Chief Complaint:/ HPI:   Left knee swelling and pain for last 1 week. Has been doing a little more gardening which includes squatting, bending> Also has started back on her walking program (3 miles a day) which she had been remiss with for a while. Did not taper up to mileage. Knee seemed to swell a small amount and has felt stiff last few days. Today it actually feel s better. Walks on pavement, small hills.   ROS:     See HPI. No giving way of joint. No lowe rextremity numbness or tingling  PERTINENT  PMH / PSH FH / / SH:  Past Medical, Surgical, Social, and Family History Reviewed & Updated in the EMR.  Pertinent findings include:  Never smoker No knee surgery  OBJECTIVE: BP 102/70   Ht 5\' 3"  (1.6 m)   Wt 180 lb (81.6 kg)   BMI 31.89 kg/m   Physical Exam:  Vital signs are reviewed. GEN WD WN NAD KNEES symmetrical. Trace effusion left knee. FROM in flexion and extension both. Painless. Left is ligamentously intact to varus and valgus, normal lachman. Negative McMurray. Calves are soft VASC DP 2+ B= NEURO intact sensation to soft touch bilateral LE SKIN around left knee no unusual warmth or redness, no lesions  LEFT KNEE QP:[id number] and patellar tendons are intact and without abnormality.  Nomral appearing quadriceps muscles. Small effusion noted in medial suprapatellar space, no debris. Lateral and medial menisci are seen and show no obvious tear. The medial menisci is slightly protruberent secondary to fluid. A few joint line osteophytes are see,  ASSESSMENT & PLAN:  See problem based charting & AVS for pt instructions.

## 2018-08-16 NOTE — Assessment & Plan Note (Signed)
Would benefit from quad strengthening Discussed activities to avoid Compression brace would benefit as would icing Discussed F/u 3-4 weeks

## 2018-09-11 ENCOUNTER — Ambulatory Visit: Payer: BC Managed Care – PPO | Admitting: Family Medicine

## 2018-10-28 ENCOUNTER — Encounter: Payer: Self-pay | Admitting: Gynecology

## 2018-11-09 DIAGNOSIS — H47323 Drusen of optic disc, bilateral: Secondary | ICD-10-CM | POA: Diagnosis not present

## 2018-11-17 ENCOUNTER — Other Ambulatory Visit: Payer: Self-pay

## 2018-11-17 DIAGNOSIS — Z20822 Contact with and (suspected) exposure to covid-19: Secondary | ICD-10-CM

## 2018-11-19 LAB — NOVEL CORONAVIRUS, NAA: SARS-CoV-2, NAA: NOT DETECTED

## 2018-12-14 ENCOUNTER — Other Ambulatory Visit: Payer: Self-pay

## 2018-12-15 ENCOUNTER — Other Ambulatory Visit: Payer: Self-pay

## 2018-12-15 ENCOUNTER — Encounter: Payer: Self-pay | Admitting: Gynecology

## 2018-12-15 ENCOUNTER — Ambulatory Visit (INDEPENDENT_AMBULATORY_CARE_PROVIDER_SITE_OTHER): Payer: BC Managed Care – PPO | Admitting: Gynecology

## 2018-12-15 VITALS — BP 118/78 | Ht 65.0 in | Wt 190.0 lb

## 2018-12-15 DIAGNOSIS — Z01419 Encounter for gynecological examination (general) (routine) without abnormal findings: Secondary | ICD-10-CM

## 2018-12-15 DIAGNOSIS — N952 Postmenopausal atrophic vaginitis: Secondary | ICD-10-CM | POA: Diagnosis not present

## 2018-12-15 DIAGNOSIS — N951 Menopausal and female climacteric states: Secondary | ICD-10-CM

## 2018-12-15 NOTE — Patient Instructions (Signed)
Try over-the-counter soy-based product such as Estroven for the menopausal symptoms.  Follow-up for your mammogram as scheduled.  Follow-up in 1 year for annual exam

## 2018-12-15 NOTE — Progress Notes (Signed)
    Holly Wolfe Dec 29, 1965 [id number]        53 y.o.  G0P0000 for annual gynecologic exam.  Has been over a year without menses or any bleeding.  Is having hot flashes and sweats and some increased anxiety.  Past medical history,surgical history, problem list, medications, allergies, family history and social history were all reviewed and documented as reviewed in the EPIC chart.  ROS:  Performed with pertinent positives and negatives included in the history, assessment and plan.   Additional significant findings : None   Exam: Holly Wolfe assistant Vitals:   12/15/18 0806  BP: 118/78  Weight: 190 lb (86.2 kg)  Height: 5\' 5"  (1.651 m)   Body mass index is 31.62 kg/m.  General appearance:  Normal affect, orientation and appearance. Skin: Grossly normal HEENT: Without gross lesions.  No cervical or supraclavicular adenopathy. Thyroid normal.  Lungs:  Clear without wheezing, rales or rhonchi Cardiac: RR, without RMG Abdominal:  Soft, nontender, without masses, guarding, rebound, organomegaly or hernia Breasts:  Examined lying and sitting without masses, retractions, discharge or axillary adenopathy. Pelvic:  Ext, BUS, Vagina: Normal with mild atrophic changes  Cervix: Normal with mild atrophic changes  Uterus: Anteverted, normal size, shape and contour, midline and mobile nontender   Adnexa: Without masses or tenderness    Anus and perineum: Normal   Rectovaginal: Normal sphincter tone without palpated masses or tenderness.    Assessment/Plan:  53 y.o. G0P0000 female for annual gynecologic exam.   1. Postmenopausal.  No bleeding since July 2019.  Is having hot flushes and sweats.  We discussed options for management to include OTC products such as Estroven up to and including HRT.  Risks versus benefits of HRT reviewed to include thrombosis such as stroke heart attack DVT in the breast cancer issue.  At this point the patient wants to try OTC products.  We will follow-up if  symptoms continue or worsen and wants to rediscuss HRT.  Will call if any bleeding. 2. Mammography 02/2017.  Has mammography scheduled in January 2021.  Breast exam normal today. 3. Pap smear/HPV 2019.  No Pap smear done today.  No history of abnormal Pap smears.  Plan repeat Pap smear/HPV at 5-year interval per current screening guidelines. 4. Colonoscopy 2017.  Repeat at their recommended interval. 5. Health maintenance.  No routine lab work done as patient does this elsewhere.  Follow-up 1 year, sooner as needed.   Anastasio Auerbach MD, 8:30 AM 12/15/2018

## 2019-01-05 ENCOUNTER — Other Ambulatory Visit: Payer: Self-pay

## 2019-01-05 ENCOUNTER — Ambulatory Visit: Payer: BC Managed Care – PPO | Admitting: Sports Medicine

## 2019-01-05 VITALS — BP 104/66 | Ht 64.0 in | Wt 185.0 lb

## 2019-01-05 DIAGNOSIS — M25562 Pain in left knee: Secondary | ICD-10-CM | POA: Diagnosis not present

## 2019-01-05 MED ORDER — MELOXICAM 15 MG PO TABS: ORAL_TABLET | ORAL | 0 refills | Status: DC

## 2019-01-05 NOTE — Progress Notes (Signed)
   [place], [place] [address] Phone: [phone] Fax: [phone]   Patient Name: Holly Wolfe Date of Birth: 12/26/1965 Medical Record Number: [id number] Gender: female Date of Encounter: 01/05/2019  SUBJECTIVE:      Chief Complaint: left knee and right shoulder   HPI:  Left knee: ~1 wk noticed a recurring of a prior pain from July when she was diagnosed with meniscal irritation.  She had completely resolved within a few weeks and was good since then.  No known specific instance/injury.   Pain is worse when going up/down stairs, no sense of knee "going out", feels like there is fluid on lateral knee.  No distal deficits or claims of infection/skin change  Right shoudler: after using a leaf blower all day noticed some persistent right shoulder pain, worse with palpation to anterior shoulder.  Hurts to rotate shoulder but worse is with heavy flexion.  No known injury or prior shoudler prolbem.  Minor ROM restriction to flexion of shoulder and abduction.     ROS:     See HPI.   PERTINENT  PMH / PSH / FH / SH:  Past Medical, Surgical, Social, and Family History Reviewed & Updated in the EMR. Pertinent findings include:  Does a walking program   OBJECTIVE:  LMP 11/25/2017  Physical Exam:  Vital signs are reviewed.   GEN: Alert and oriented, NAD Pulm: Breathing unlabored PSY: normal mood, congruent affect  MSK: Left knee with no valgus/varus/drawer laxity.  No erythema but there is a palpable fluctuance at superior/lateral left knee assymetric with right.  US exam shows lateral fluid accumulation but not in central patellar tendon, some bone spurring on lateral meniscus via [place]  Right shoulder: no assymetry to full/empty can test, good exterior/interior rotation strength, patient claims specific pain to palpation of biceps tendon insertion.  Minimal ROM restriction to abduction/flexion of shoulder.  ASSESSMENT & PLAN:  1. Left knee OA/versus lateral meniscus bone spurring-offered steroid injection but declined.  We will get x-ray series of knee with standing AP, lateral and sunset views.  Mobic 15 mg for 1 week followed with telephone check-in for status.  Will review prior prescribed knee strengthening exercises with patient 2. Right shoulder biceps tendon inflammation: No imaging or procedure indicated at this time, 1 week Mobic 15 mg followed by telephone check in for status   Dr. Sherene Sires, PGY3 FM  Patient seen and evaluated with the resident.  I agree with the above plan of care.  Ultrasound evaluation today does show a very small knee effusion on the left.  Given the fact that she has pain in the left knee and right shoulder we will try a short course of meloxicam.  I will follow-up with her in 1 week via telephone to see how she is doing.  I did mention the possibility of cortisone injection for the left knee if symptoms persist.  If right shoulder pain persists then I would consider an ultrasound specifically to evaluate the biceps tendon and rotator cuff.

## 2019-01-06 ENCOUNTER — Ambulatory Visit
Admission: RE | Admit: 2019-01-06 | Discharge: 2019-01-06 | Disposition: A | Discharge status: Home / Self Care | Payer: BC Managed Care – PPO | Source: Ambulatory Visit | Attending: Sports Medicine | Admitting: Sports Medicine

## 2019-01-06 DIAGNOSIS — M25562 Pain in left knee: Secondary | ICD-10-CM

## 2019-01-06 DIAGNOSIS — M1712 Unilateral primary osteoarthritis, left knee: Secondary | ICD-10-CM | POA: Diagnosis not present

## 2019-01-11 ENCOUNTER — Telehealth: Payer: Self-pay | Admitting: Sports Medicine

## 2019-01-11 NOTE — Telephone Encounter (Signed)
  I spoke with Holly Wolfe on the phone today and discussed MRI findings of her left knee.  She has some mild osteoarthritic change but nothing severe.  She did not notice much benefit from the meloxicam but does state that her left knee appears less swollen and she was able to walk a couple of miles yesterday with minimal discomfort.  She is still experiencing right shoulder pain which she describes as a "pulling" type of sensation.  Her husband has some topical Voltaren which she has decided to try.  She understands that if her left knee pain does not continue to improve then we could consider a cortisone injection before further diagnostic imaging.  If right shoulder pain persists, she will return to the office for complete right shoulder ultrasound.  Follow-up as needed.

## 2019-01-19 ENCOUNTER — Ambulatory Visit: Payer: Self-pay

## 2019-01-19 ENCOUNTER — Encounter: Payer: Self-pay | Admitting: Sports Medicine

## 2019-01-19 ENCOUNTER — Other Ambulatory Visit: Payer: Self-pay

## 2019-01-19 ENCOUNTER — Ambulatory Visit (INDEPENDENT_AMBULATORY_CARE_PROVIDER_SITE_OTHER): Payer: BC Managed Care – PPO | Admitting: Sports Medicine

## 2019-01-19 VITALS — BP 100/70 | Ht 64.0 in | Wt 185.0 lb

## 2019-01-19 DIAGNOSIS — M25562 Pain in left knee: Secondary | ICD-10-CM

## 2019-01-19 DIAGNOSIS — M25511 Pain in right shoulder: Secondary | ICD-10-CM | POA: Diagnosis not present

## 2019-01-19 MED ORDER — METHYLPREDNISOLONE ACETATE 40 MG/ML IJ SUSP
40.0000 mg | Freq: Once | INTRAMUSCULAR | Status: AC
  Administered 2019-01-19: 40 mg via INTRA_ARTICULAR

## 2019-01-19 NOTE — Progress Notes (Addendum)
[place] [address] [place], [place] [address] Phone: [phone] Fax: [phone]   Patient Name: Holly Wolfe Date of Birth: [birth year] Medical Record Number: [id number] Gender: female Date of Encounter: 01/19/2019  SUBJECTIVE:      Chief Complaint:  Left knee and right shoulder pain   HPI:  Left knee: He presents for follow-up of left shoulder pain.  Over the summer she was diagnosed with a meniscal pathology.  She was seen 2 weeks ago, at which time knee XR were taken and she was started on Mobic for 1 week.  She was then scheduled for an MRI which showed osteoarthritic changes. She had been prescribed a home exercise program in the past, and those exercises were demonstrated again for her.  2 weeks ago at her appointment, she declined a steroid injection, but is interested in an injection today.  Right shoulder: SHe also complains of right shoulder pain at the front of her shoulder.  She has pain when reaching overhead and extending her arm back.  No mechanism of injury.  Has not injured this shoulder before.  Denies any numbness or tingling into her fingers.  She gets some pain when lifting something heavy in that arm.  She denies any swelling, erythema, skin changes, numbness, or radiating pain. Describes the pain as a deep ache.     ROS:     See HPI.   PERTINENT  PMH / PSH / FH / SH:  Past Medical, Surgical, Social, and Family History Reviewed & Updated in the EMR. Pertinent findings include:  Lateral epicondylitis   OBJECTIVE:  Ht 5\' 4"  (1.626 m)   Wt 185 lb (83.9 kg)   LMP 11/25/2017   BMI 31.76 kg/m  Physical Exam:  Vital signs are reviewed.   GEN: Alert and oriented, NAD Pulm: Breathing unlabored PSY: normal mood, congruent affect  MSK: Right knee: Normal to inspection with no erythema or obvious bony abnormalities. Small superolateral effusion Mild TTP along bilateral joint lines ROM full in flexion and extension  and lower leg rotation. Ligaments with solid consistent endpoints including ACL, PCL, LCL, MCL. Negative Mcmurray's and Thessaly tests. painful patellar compression. Patellar glide without crepitus. Patellar and quadriceps tendons unremarkable. Hamstring and quadriceps strength is normal.  Pain with deep squat Neurovascularly intact.  Right shoulder Well developed, well nourished, in no acute distress. No swelling, ecchymoses.  No gross deformity. TTP at coracoid process. FROM. Biceps and triceps strength 5/5 External rotation and abduction 4/5 Positive Neers. Negative Yergasons. Negative apprehension. NV intact distally.  Limited MSK Ultrasound: Right shoulder No evidence of joint effusion.   The biceps brachii long head tendon is normal without tendinosis, tear, tenosynovitis, or subluxation/dislocation in short and long axis view. Supraspinatus, infraspinatus, subscapularis, and teres minor tendons visualized with mild hypoechoic changes without tear Inflammation of subacromial bursa Inflammation of subcoracoid bursa Posterior labrum is unremarkable AC joint visualized in the long axis with no abnormality  Impression: Subacromial and subcoracoid bursitis with mild rotator cuff tendinosis  Procedure  After informed written consent timeout was performed, patient was supine on exam table with knees bent off the table.  Left knee was prepped with alcohol swab and utilizing anteromedial approach, patient's right knee was injected intraarticularly with 3:1 bupivicaine: depomedrol. Patient tolerated the procedure well without immediate complications.    ASSESSMENT & PLAN:   1. Left knee osteoarthritis with exacerbation  Low phone message states patient had MRI, it was just a knee x-ray that she had  and results communicated with her last week.  Successful injection as above.  We are hopeful this will provide many months of relief.  Explained the importance of continuing home  exercise therapy.  Continue to use ice and anti-inflammatory medication as needed.  She will follow-up in 3 months or sooner if symptoms worsen, at which time we can consider MRI of the right knee.  2.  Right shoulder bursitis and tendinosis  Patient would like to hold off on any therapeutic injection at this time.  She was given a handout for rotator cuff strengthening exercises to start at home.  She will follow-up with [place] in 6 weeks or sooner, at which time she would likely benefit from from a subacromial bursa injection.  Lanier Clam, DO, ATC Sports Medicine Fellow  Patient seen and evaluated with the sports medicine fellow.  I agree with the above plan of care.

## 2019-02-16 DIAGNOSIS — Z1231 Encounter for screening mammogram for malignant neoplasm of breast: Secondary | ICD-10-CM | POA: Diagnosis not present

## 2019-02-19 DIAGNOSIS — R140 Abdominal distension (gaseous): Secondary | ICD-10-CM | POA: Diagnosis not present

## 2019-03-11 ENCOUNTER — Other Ambulatory Visit: Payer: Self-pay

## 2019-03-11 ENCOUNTER — Ambulatory Visit: Payer: BC Managed Care – PPO | Admitting: Pediatrics

## 2019-03-11 VITALS — BP 106/78 | Ht 64.0 in | Wt 185.0 lb

## 2019-03-11 DIAGNOSIS — M25562 Pain in left knee: Secondary | ICD-10-CM | POA: Diagnosis not present

## 2019-03-11 NOTE — Progress Notes (Signed)
  Holly Wolfe - 54 y.o. female MRN [id number]  Date of birth: 1966-01-15  SUBJECTIVE:   CC: left knee pain, follow up  Left knee: chronic left knee pain, diagnosed with meniscal pathology. Seen on 01/05/2019 and had XR that showed mild arthritis, was started on Mobic which did not help with pain. She also started home exercise program. She had steroid injection on 12/15 which did not help with pain. She feels like her knee is bothering her more now. Swelling daily. Most painful walking up and down stairs and when crossing one leg over another. No catching/locking or feeling of instability.  ROS: No unexpected weight loss, fever, chills, swelling, instability, muscle pain, numbness/tingling, redness, otherwise see HPI   PMHx - Updated and reviewed.  Contributory factors include: Negative PSHx - Updated and reviewed.  Contributory factors include:  Negative FHx - Updated and reviewed.  Contributory factors include:  Negative Social Hx - Updated and reviewed. Contributory factors include: Negative Medications - reviewed   DATA REVIEWED: Prior records  PHYSICAL EXAM:  VS: BP:106/78  HR: bpm  TEMP: ( )  RESP:   HT:5\' 4"  (162.6 cm)   WT:185 lb (83.9 kg)  BMI:31.74 PHYSICAL EXAM: Gen: NAD, alert, cooperative with exam, well-appearing HEENT: clear conjunctiva,  CV:  no edema, capillary refill brisk, normal rate Resp: non-labored Skin: no rashes, normal turgor  Neuro: no gross deficits.  Psych:  alert and oriented   Left knee: - Inspection: 2+ effusion - Palpation: TTP over lateral joint line as well as superior lateral patellar space - ROM: full active ROM with flexion and extension in knee and hip - Strength: 5/5 strength - Neuro/vasc: NV intact - Special Tests: - LIGAMENTS: negative anterior and posterior drawer, no MCL or LCL laxity  -- MENISCUS: pain with McMurray's -- PF JOINT: nml patellar mobility bilaterally- pain with patellar movement   ASSESSMENT & PLAN:   Left  knee pain for several months with mild osteoarthritis on x-ray, concerning for meniscal pathology causing persistent pain and swelling despite several months of anti-inflammatories, home therapy, and corticosteroid injection. Will trial voltaren for pain as meloxicam is not helping and icing PRN for pain, swelling. Will obtain MRI to evaluate for meniscal pathology and to fully see degree of arthritis. Will call her with results and next steps in management.   I was the preceptor for this visit and available for immediate consultation.  I agree with the MRI.  X-rays show mild degenerative changes but not marked.  Patient still having symptoms after a recent cortisone injection.  We will get an MRI specifically to rule out a degenerative meniscal tear.  Phone follow-up with those results when available and we will delineate a more definitive treatment plan based on those findings. Shellia Cleverly, DO

## 2019-03-16 DIAGNOSIS — E7800 Pure hypercholesterolemia, unspecified: Secondary | ICD-10-CM | POA: Diagnosis not present

## 2019-03-16 DIAGNOSIS — Z79899 Other long term (current) drug therapy: Secondary | ICD-10-CM | POA: Diagnosis not present

## 2019-03-18 ENCOUNTER — Ambulatory Visit: Payer: BC Managed Care – PPO

## 2019-03-19 DIAGNOSIS — Z0000 Encounter for general adult medical examination without abnormal findings: Secondary | ICD-10-CM | POA: Diagnosis not present

## 2019-03-19 DIAGNOSIS — E7800 Pure hypercholesterolemia, unspecified: Secondary | ICD-10-CM | POA: Diagnosis not present

## 2019-03-19 DIAGNOSIS — Z79899 Other long term (current) drug therapy: Secondary | ICD-10-CM | POA: Diagnosis not present

## 2019-03-20 ENCOUNTER — Other Ambulatory Visit: Payer: BC Managed Care – PPO

## 2019-04-05 ENCOUNTER — Ambulatory Visit
Admission: RE | Admit: 2019-04-05 | Discharge: 2019-04-05 | Disposition: A | Discharge status: Home / Self Care | Payer: BC Managed Care – PPO | Source: Ambulatory Visit | Attending: Sports Medicine | Admitting: Sports Medicine

## 2019-04-05 DIAGNOSIS — M25562 Pain in left knee: Secondary | ICD-10-CM | POA: Diagnosis not present

## 2019-04-07 DIAGNOSIS — Z23 Encounter for immunization: Secondary | ICD-10-CM | POA: Diagnosis not present

## 2019-04-14 ENCOUNTER — Other Ambulatory Visit: Payer: Self-pay

## 2019-04-14 ENCOUNTER — Ambulatory Visit: Payer: BC Managed Care – PPO | Admitting: Sports Medicine

## 2019-04-14 VITALS — BP 124/74 | Ht 64.0 in | Wt 184.0 lb

## 2019-04-14 DIAGNOSIS — M1712 Unilateral primary osteoarthritis, left knee: Secondary | ICD-10-CM

## 2019-04-14 MED ORDER — METHYLPREDNISOLONE ACETATE 40 MG/ML IJ SUSP: 40.0000 mg | Freq: Once | INTRAMUSCULAR | Status: AC

## 2019-04-14 NOTE — Addendum Note (Signed)
Addended by: Jolinda Croak E on: 04/14/2019 10:11 AM   Modules accepted: Orders

## 2019-04-14 NOTE — Progress Notes (Signed)
Patient ID: Holly Wolfe, female   DOB: [birth year], 54 y.o.   MRN: [id number]  Shirrell comes in today for a repeat cortisone injection into her left knee.  Recent MRI of the left knee showed areas of full-thickness cartilage loss in the patellofemoral joint.  No evidence of meniscal pathology.  We are going to try a second cortisone injection today.  If she continues to have pain then future options could include viscosupplementation versus orthopedic surgical referral (she would prefer to see Dr. Rudene Christians in [place]).  I have recommended that she avoid squats and lunges when exercising.  She is okay to walk using pain as her guide.  Follow-up for ongoing or recalcitrant issues.  Consent obtained and verified. Time-out conducted. Noted no overlying erythema, induration, or other signs of local infection. Skin prepped in a sterile fashion. Topical analgesic spray: Ethyl chloride. Joint: left knee, anterior lateral approach Needle: 25g 1.5 inch Completed without difficulty. Meds: 3cc 1% xylocaine, 1cc (40mg ) depomedrol  Advised to call if fevers/chills, erythema, induration, drainage, or persistent bleeding.

## 2019-05-04 DIAGNOSIS — Z23 Encounter for immunization: Secondary | ICD-10-CM | POA: Diagnosis not present

## 2019-06-07 DIAGNOSIS — M1712 Unilateral primary osteoarthritis, left knee: Secondary | ICD-10-CM | POA: Diagnosis not present

## 2019-07-06 DIAGNOSIS — M1712 Unilateral primary osteoarthritis, left knee: Secondary | ICD-10-CM | POA: Diagnosis not present

## 2019-07-13 DIAGNOSIS — M1712 Unilateral primary osteoarthritis, left knee: Secondary | ICD-10-CM | POA: Diagnosis not present

## 2019-07-20 DIAGNOSIS — M1712 Unilateral primary osteoarthritis, left knee: Secondary | ICD-10-CM | POA: Diagnosis not present

## 2019-09-10 DIAGNOSIS — E7800 Pure hypercholesterolemia, unspecified: Secondary | ICD-10-CM | POA: Diagnosis not present

## 2019-09-10 DIAGNOSIS — Z79899 Other long term (current) drug therapy: Secondary | ICD-10-CM | POA: Diagnosis not present

## 2019-09-23 DIAGNOSIS — E7800 Pure hypercholesterolemia, unspecified: Secondary | ICD-10-CM | POA: Diagnosis not present

## 2019-10-21 DIAGNOSIS — M1712 Unilateral primary osteoarthritis, left knee: Secondary | ICD-10-CM | POA: Diagnosis not present

## 2019-12-16 ENCOUNTER — Encounter: Payer: BC Managed Care – PPO | Admitting: Nurse Practitioner

## 2020-01-06 ENCOUNTER — Encounter: Payer: Self-pay | Admitting: Nurse Practitioner

## 2020-01-06 ENCOUNTER — Other Ambulatory Visit: Payer: Self-pay

## 2020-01-06 ENCOUNTER — Ambulatory Visit: Payer: BC Managed Care – PPO | Admitting: Nurse Practitioner

## 2020-01-06 VITALS — BP 126/78 | Ht 64.0 in | Wt 181.0 lb

## 2020-01-06 DIAGNOSIS — N941 Unspecified dyspareunia: Secondary | ICD-10-CM

## 2020-01-06 DIAGNOSIS — Z01419 Encounter for gynecological examination (general) (routine) without abnormal findings: Secondary | ICD-10-CM | POA: Diagnosis not present

## 2020-01-06 NOTE — Patient Instructions (Signed)

## 2020-01-06 NOTE — Progress Notes (Signed)
   Holly Wolfe [birth year] [id number]   History:  54 y.o. G0 presents for annual exam. Postmenopausal - no HRT, no bleeding. Minimal menopausal symptoms. Does complain of painful intercourse due to dryness, does not use lubrication. Normal pap and mammogram history.   Gynecologic History Patient's last menstrual period was 11/25/2017.   Contraception: post menopausal status Last Pap: 12/12/2017. Results were: normal Last mammogram: 02/16/2019. Results were: normal Last colonoscopy: 2017. Results were: normal Last Dexa: Never  Past medical history, past surgical history, family history and social history were all reviewed and documented in the EPIC chart.  ROS:  A ROS was performed and pertinent positives and negatives are included.  Exam:  Vitals:   01/06/20 1213  BP: 126/78  Weight: 181 lb (82.1 kg)  Height: 5\' 4"  (1.626 m)   Body mass index is 31.07 kg/m.  General appearance:  Normal Thyroid:  Symmetrical, normal in size, without palpable masses or nodularity. Respiratory  Auscultation:  Clear without wheezing or rhonchi Cardiovascular  Auscultation:  Regular rate, without rubs, murmurs or gallops  Edema/varicosities:  Not grossly evident Abdominal  Soft,nontender, without masses, guarding or rebound.  Liver/spleen:  No organomegaly noted  Hernia:  None appreciated  Skin  Inspection:  Grossly normal   Breasts: Examined lying and sitting.   Right: Without masses, retractions, discharge or axillary adenopathy.   Left: Without masses, retractions, discharge or axillary adenopathy. Gentitourinary   Inguinal/mons:  Normal without inguinal adenopathy  External genitalia:  Normal  BUS/Urethra/Skene's glands:  Normal  Vagina:  Normal  Cervix:  Normal  Uterus:  Normal in size, shape and contour.  Midline and mobile  Adnexa/parametria:     Rt: Without masses or tenderness.   Lt: Without masses or tenderness.  Anus and perineum: Normal  Digital rectal exam: Normal  sphincter tone without palpated masses or tenderness  Assessment/Plan:  54 y.o. G0 for annual exam.   Well female exam with routine gynecological exam - Education provided on SBEs, importance of preventative screenings, current guidelines, high calcium diet, regular exercise, and multivitamin daily. Labs with PCP.  Dyspareunia in female -recommend using an oil-based lubricant or coconut oil.  Replens OTC 2-3 times per week for maintenance moisture.  If symptoms do not improve we discussed possibility of using an estrogen vaginal cream.  Screening for cervical cancer -normal Pap history.  Will repeat at 5-year interval per guidelines.  Screening for breast cancer -normal mammogram history.  Continue annual screenings.  Normal breast exam today.  Screening for colon cancer - Will repeat screening colonoscopy at GI's recommended interval.  Follow-up in 1 year for annual.     Tamela Gammon [facility], 12:21 PM 01/06/2020

## 2020-03-07 DIAGNOSIS — Z1231 Encounter for screening mammogram for malignant neoplasm of breast: Secondary | ICD-10-CM | POA: Diagnosis not present

## 2020-09-15 ENCOUNTER — Ambulatory Visit: Payer: 59 | Admitting: Sports Medicine

## 2020-09-15 ENCOUNTER — Other Ambulatory Visit: Payer: Self-pay

## 2020-09-15 VITALS — Ht 64.0 in | Wt 174.0 lb

## 2020-09-15 DIAGNOSIS — M542 Cervicalgia: Secondary | ICD-10-CM | POA: Diagnosis not present

## 2020-09-15 MED ORDER — DICLOFENAC SODIUM 75 MG PO TBEC: DELAYED_RELEASE_TABLET | ORAL | 0 refills | Status: DC

## 2020-09-15 NOTE — Progress Notes (Signed)
   Subjective:    Patient ID: Holly Wolfe, female    DOB: 02-01-1966, 55 y.o.   MRN: [id number]  55 year old female presents to clinic for evaluation of 1 month history of anterior neck pain.  She states that she has built a new deck in her backyard with her husband, and has been working on that a lot. The pain came on rather abruptly after 1 day of work about a month ago and has persisted.  The pain is localized to her anterior neck-it started below her clavicle under the pec tendon insertion area, has gradually progressed to her anterior neck, posterior to the sternocleidomastoid area.  She denies any arm symptoms such as numbness or tingling, denies any neck pain with range of motion, denies weakness in her upper extremities.  She has tried ibuprofen for the pain which has not helped so far.  The pain has been stable over time, not getting better or worse.  Review of Systems As above    Objective:   Physical Exam Constitutional:      Appearance: Normal appearance.  HENT:     Head: Normocephalic and atraumatic.  Eyes:     Extraocular Movements: Extraocular movements intact.  Pulmonary:     Effort: Pulmonary effort is normal.  Musculoskeletal:     Cervical back: Normal range of motion and neck supple.     Comments: -Shoulders symmetric  -Nontender to palpation at [facility] joint, biceps tendon.  She is tender to palpation the anterior neck, posterior to the sternocleidomastoid on the right -Full active and passive range of motion of the neck and shoulders, reproduces anterior neck pain -5/5 strength in bilateral upper extremities, well as all rotator cuff muscles -No impingement-no pain with Hawkins or Neer's-however does reproduce anterior neck pain.  Negative O'Brien's-reproduces anterior neck pain -No numbness or tingling in the bilateral upper extremities.  Positive Tinel's behind the sternocleidomastoid and behind pec tendon for pain-no numbness or tingling. -no muscle wasting noted   Neurological:     Mental Status: She is alert.     Motor: No weakness.      Assessment & Plan:   55 year old female presents with 1 month history of right anterior neck pain  -Suspect that this may be muscle strain versus neurogenic thoracic outlet syndrome however this is lower in the differential since she does not have any numbness, tingling, weakness going into her arms.  1 month has been a fairly prolonged amount of time for muscle strain symptoms to persist, however will initiate treatment with conservative therapy with a 7-day course of anti-inflammatories (diclofenac twice daily) and physical therapy.  She will follow up in 4 weeks to evaluate her progress to ensure improvement in her symptoms.  I will also set up a consultation with Dr. Thedore Mins to see if he has anything to add.  We will try to schedule this appointment for 2 weeks from now.  Electronically signed by: Sherrell Puller MD PGY-4 PM&R  Patient seen and evaluated with the resident.  I agree with the above plan of care.  Patient's pain seems to be directly over the sternocleidomastoid on the right.  We will try some oral anti-inflammatories and physical therapy.  Would like to have the patient see Dr. Thedore Mins for his input.  We will schedule that for a couple of weeks from now.  She will then follow-up with me 2 weeks later.

## 2020-09-15 NOTE — Patient Instructions (Addendum)
We are going to set up an appt for you to see Dr. Thedore Mins who is a physical medicine and rehab physician.   Holly Wolfe, [place]  You will be contacted with this appt information.

## 2020-10-17 ENCOUNTER — Emergency Department
Admission: EM | Admit: 2020-10-17 | Discharge: 2020-10-17 | Disposition: A | Discharge status: Home / Self Care | Payer: 59 | Attending: Student in an Organized Health Care Education/Training Program | Admitting: Student in an Organized Health Care Education/Training Program

## 2020-10-17 ENCOUNTER — Encounter: Payer: Self-pay | Admitting: Emergency Medicine

## 2020-10-17 ENCOUNTER — Emergency Department: Payer: 59

## 2020-10-17 ENCOUNTER — Other Ambulatory Visit: Payer: Self-pay

## 2020-10-17 DIAGNOSIS — R079 Chest pain, unspecified: Secondary | ICD-10-CM | POA: Diagnosis not present

## 2020-10-17 DIAGNOSIS — Z5321 Procedure and treatment not carried out due to patient leaving prior to being seen by health care provider: Secondary | ICD-10-CM | POA: Diagnosis not present

## 2020-10-17 LAB — CBC
HCT: 38.3 % (ref 36.0–46.0)
Hemoglobin: 12.8 g/dL (ref 12.0–15.0)
MCH: 31.4 pg (ref 26.0–34.0)
MCHC: 33.4 g/dL (ref 30.0–36.0)
MCV: 94.1 fL (ref 80.0–100.0)
Platelets: 249 10*3/uL (ref 150–400)
RBC: 4.07 MIL/uL (ref 3.87–5.11)
RDW: 13.5 % (ref 11.5–15.5)
WBC: 7.2 10*3/uL (ref 4.0–10.5)
nRBC: 0 % (ref 0.0–0.2)

## 2020-10-17 LAB — BASIC METABOLIC PANEL
Anion gap: 8 (ref 5–15)
BUN: 18 mg/dL (ref 6–20)
CO2: 29 mmol/L (ref 22–32)
Calcium: 9.1 mg/dL (ref 8.9–10.3)
Chloride: 100 mmol/L (ref 98–111)
Creatinine, Ser: 0.79 mg/dL (ref 0.44–1.00)
GFR, Estimated: >60 mL/min (ref >60)
Glucose, Bld: 103 mg/dL — ABNORMAL HIGH (ref 70–99)
Potassium: 3.6 mmol/L (ref 3.5–5.1)
Sodium: 137 mmol/L (ref 135–145)

## 2020-10-17 LAB — PROTIME-INR
INR: 0.9 (ref 0.8–1.2)
Prothrombin Time: 12.1 seconds (ref 11.4–15.2)

## 2020-10-17 LAB — TROPONIN I (HIGH SENSITIVITY): Troponin I (High Sensitivity): 3 ng/L (ref <18)

## 2020-10-17 NOTE — ED Triage Notes (Signed)
Pt reports that she is having pain underneath her left breast. It started 2 hours PTA. She drank some Ginger Ale and was belching but it did not help. Denies any SHOB, sweaty or nauseated

## 2020-11-20 ENCOUNTER — Ambulatory Visit
Admission: RE | Admit: 2020-11-20 | Discharge: 2020-11-20 | Disposition: A | Discharge status: Home / Self Care | Payer: 59 | Source: Ambulatory Visit | Attending: Sports Medicine | Admitting: Sports Medicine

## 2020-11-20 ENCOUNTER — Ambulatory Visit: Payer: 59 | Admitting: Sports Medicine

## 2020-11-20 VITALS — Ht 63.0 in | Wt 179.8 lb

## 2020-11-20 DIAGNOSIS — M79645 Pain in left finger(s): Secondary | ICD-10-CM | POA: Diagnosis not present

## 2020-11-20 NOTE — Progress Notes (Signed)
Pt sent for images

## 2020-11-20 NOTE — Progress Notes (Signed)
   Subjective:    Patient ID: Holly Wolfe, female    DOB: 03-27-1965, 55 y.o.   MRN: [id number]  HPI chief complaint: Left ring finger pain  Holly Wolfe presents today complaining of left ring finger pain that she localizes to the proximal phalanx.  Approximately 2 weeks ago, she had the lid to her trash bin close unexpectedly on this finger.  Immediate pain.  She did develop swelling and bruising shortly thereafter.  She denies pain elsewhere in the hand.  No pain in any other fingers.    Review of Systems As above    Objective:   Physical Exam  Well-developed, well-nourished.  No acute distress  Examination of the left hand with attention to the left ring finger shows tenderness to palpation along the proximal phalanx but no crepitus.  Mild soft tissue swelling dorsally on the proximal phalanx.  No obvious ecchymosis.  No clinical angulation or malrotation.  Flexor and extensor tendons are intact.  No other tenderness to palpation through the rest of the hand or the other fingers.  Brisk capillary refill.  X-rays of the left ring finger including AP, lateral, and oblique show no obvious fracture. Limited MSK ultrasound of the dorsum of the left ring finger does show a small hematoma overlying the proximal phalanx.      Assessment & Plan:   Left ring finger pain secondary to contusion and small hematoma  Reassurance regarding x-rays.  Buddy tape fourth and fifth fingers for comfort.  I encouraged active range of motion.  Resume activity as tolerated and follow-up as needed.  This note was dictated using Dragon naturally speaking software and may contain errors in syntax, spelling, or content which have not been identified prior to signing this note.

## 2021-01-08 ENCOUNTER — Ambulatory Visit: Payer: BC Managed Care – PPO | Admitting: Nurse Practitioner

## 2021-02-12 ENCOUNTER — Ambulatory Visit: Payer: 59 | Admitting: Nurse Practitioner

## 2021-03-01 ENCOUNTER — Ambulatory Visit (INDEPENDENT_AMBULATORY_CARE_PROVIDER_SITE_OTHER): Payer: 59 | Admitting: Nurse Practitioner

## 2021-03-01 ENCOUNTER — Other Ambulatory Visit: Payer: Self-pay

## 2021-03-01 ENCOUNTER — Encounter: Payer: Self-pay | Admitting: Nurse Practitioner

## 2021-03-01 VITALS — BP 116/82 | Ht 63.5 in | Wt 190.0 lb

## 2021-03-01 DIAGNOSIS — N941 Unspecified dyspareunia: Secondary | ICD-10-CM | POA: Diagnosis not present

## 2021-03-01 DIAGNOSIS — Z01419 Encounter for gynecological examination (general) (routine) without abnormal findings: Secondary | ICD-10-CM | POA: Diagnosis not present

## 2021-03-01 DIAGNOSIS — N952 Postmenopausal atrophic vaginitis: Secondary | ICD-10-CM

## 2021-03-01 DIAGNOSIS — Z780 Asymptomatic menopausal state: Secondary | ICD-10-CM | POA: Diagnosis not present

## 2021-03-01 MED ORDER — ESTRADIOL 0.1 MG/GM VA CREA: 1.0000 | TOPICAL_CREAM | Freq: Every day | VAGINAL | 12 refills | Status: DC

## 2021-03-01 MED ORDER — ESTRADIOL 0.1 MG/GM VA CREA: 1.0000 g | TOPICAL_CREAM | Freq: Every day | VAGINAL | 12 refills | Status: DC

## 2021-03-01 NOTE — Progress Notes (Signed)
Holly Wolfe 12-18-1965 [id number]   History:  56 y.o. G0 presents for annual exam. Postmenopausal - no HRT, no bleeding. Minimal menopausal symptoms.She has pain with intercourse. She has tried coconut oil for lubrication with little improvement.  Normal pap and mammogram history.   Gynecologic History Patient's last menstrual period was 11/25/2017.   Contraception: post menopausal status Sexually active: Yes  Health maintenance Last Pap: 12/12/2017. Results were: Normal, 5-year repeat Last mammogram: 03/07/2020. Results were: Normal Last colonoscopy: 07/12/2015. Results were: Normal, 10-year recall Last Dexa: Not indicated  Past medical history, past surgical history, family history and social history were all reviewed and documented in the EPIC chart. Married. Working full time for [employer]. Mother passed in October.   ROS:  A ROS was performed and pertinent positives and negatives are included.  Exam:  Vitals:   03/01/21 1430  BP: 116/82  Weight: 190 lb (86.2 kg)  Height: 5' 3.5" (1.613 m)    Body mass index is 33.13 kg/m.  General appearance:  Normal Thyroid:  Symmetrical, normal in size, without palpable masses or nodularity. Respiratory  Auscultation:  Clear without wheezing or rhonchi Cardiovascular  Auscultation:  Regular rate, without rubs, murmurs or gallops  Edema/varicosities:  Not grossly evident Abdominal  Soft,nontender, without masses, guarding or rebound.  Liver/spleen:  No organomegaly noted  Hernia:  None appreciated  Skin  Inspection:  Grossly normal   Breasts: Examined lying and sitting.   Right: Without masses, retractions, discharge or axillary adenopathy.   Left: Without masses, retractions, discharge or axillary adenopathy. Genitourinary   Inguinal/mons:  Normal without inguinal adenopathy  External genitalia:  Normal appearing vulva with no masses, tenderness, or lesions  BUS/Urethra/Skene's glands:  Normal  Vagina:  Normal appearing with  normal color and discharge, no lesions. Atrophic changes  Cervix:  Normal appearing without discharge or lesions  Uterus:  Normal in size, shape and contour.  Midline and mobile, nontender  Adnexa/parametria:     Rt: Normal in size, without masses or tenderness.   Lt: Normal in size, without masses or tenderness.  Anus and perineum: Normal  Digital rectal exam: Normal sphincter tone without palpated masses or tenderness  Patient informed chaperone available to be present for breast and pelvic exam. Patient has requested no chaperone to be present. Patient has been advised what will be completed during breast and pelvic exam.   Assessment/Plan:  56 y.o. G0 for annual exam.   Well female exam with routine gynecological exam - Education provided on SBEs, importance of preventative screenings, current guidelines, high calcium diet, regular exercise, and multivitamin daily. Labs with PCP.  Postmenopausal - no HRT, no bleeding  Postmenopausal atrophic vaginitis - Plan: estradiol (ESTRACE VAGINAL) 0.1 MG/GM vaginal cream twice weekly. Initially she will use nightly x 1 week, then every other night x 1 week, then twice weekly for duration of use. She is aware of risk for small amount of systemic absorption increasing her risk for blood clots, heart attack, stroke, and breast cancer.   Dyspareunia in female - Plan: estradiol (ESTRACE VAGINAL) 0.1 MG/GM vaginal cream twice weekly. Continue using coconut oil for lubrication.   Screening for cervical cancer - Normal Pap history.  Will repeat at 5-year interval per guidelines.  Screening for breast cancer - Normal mammogram history.  Continue annual screenings.  Normal breast exam today.  Screening for colon cancer - 2017 colonoscopy. Will repeat screening colonoscopy at GI's recommended interval.  Screening for osteoporosis - Average risk. Will plan for DXA at  age 56.   Follow-up in 1 year for annual.     Tamela Gammon [facility], 2:59 PM  03/01/2021

## 2021-04-10 ENCOUNTER — Telehealth: Payer: Self-pay

## 2021-04-10 DIAGNOSIS — B009 Herpesviral infection, unspecified: Secondary | ICD-10-CM

## 2021-04-10 MED ORDER — VALACYCLOVIR HCL 1 G PO TABS: 1000.0000 mg | ORAL_TABLET | Freq: Two times a day (BID) | ORAL | 0 refills | Status: DC

## 2021-04-10 NOTE — Telephone Encounter (Signed)
AEX 03/01/21 ?Patient called because she has history of HSV.  No outbreak since 2016 but has one now due to a lot of stress.  Requesting Rx for Valtrex 1 mg. ?

## 2021-04-10 NOTE — Telephone Encounter (Signed)
Left message in patient's voice mail that Rx sent. ?

## 2021-04-10 NOTE — Telephone Encounter (Signed)
Prescription sent. Thanks

## 2021-11-16 ENCOUNTER — Other Ambulatory Visit: Payer: Self-pay

## 2021-11-16 MED ORDER — COMIRNATY 30 MCG/0.3ML IM SUSP
INTRAMUSCULAR | 0 refills | Status: DC
  Filled 2021-11-16: qty 0.3, 1d supply, fill #0

## 2021-11-27 ENCOUNTER — Other Ambulatory Visit: Payer: Self-pay

## 2021-11-27 MED ORDER — ATORVASTATIN CALCIUM 10 MG PO TABS
ORAL_TABLET | ORAL | 0 refills | Status: DC
  Filled 2021-11-27: qty 90, 90d supply, fill #0

## 2022-01-01 ENCOUNTER — Other Ambulatory Visit: Payer: Self-pay

## 2022-01-01 MED ORDER — PANTOPRAZOLE SODIUM 40 MG PO TBEC
DELAYED_RELEASE_TABLET | ORAL | 11 refills | Status: DC
  Filled 2022-01-01: qty 90, 90d supply, fill #0

## 2022-01-15 ENCOUNTER — Other Ambulatory Visit: Payer: Self-pay

## 2022-01-15 MED ORDER — ESCITALOPRAM OXALATE 10 MG PO TABS
15.0000 mg | ORAL_TABLET | Freq: Every day | ORAL | 1 refills | Status: DC
  Filled 2022-01-15: qty 135, 90d supply, fill #0
  Filled 2022-05-04: qty 135, 90d supply, fill #1

## 2022-02-01 IMAGING — CR DG FINGER RING 2+V*L*
3 series · 3 of 3 positions shown · non-contrast
Comparison: None.

CLINICAL DATA: finger pain

EXAM:
LEFT RING FINGER 2+V

[x finger pa left]
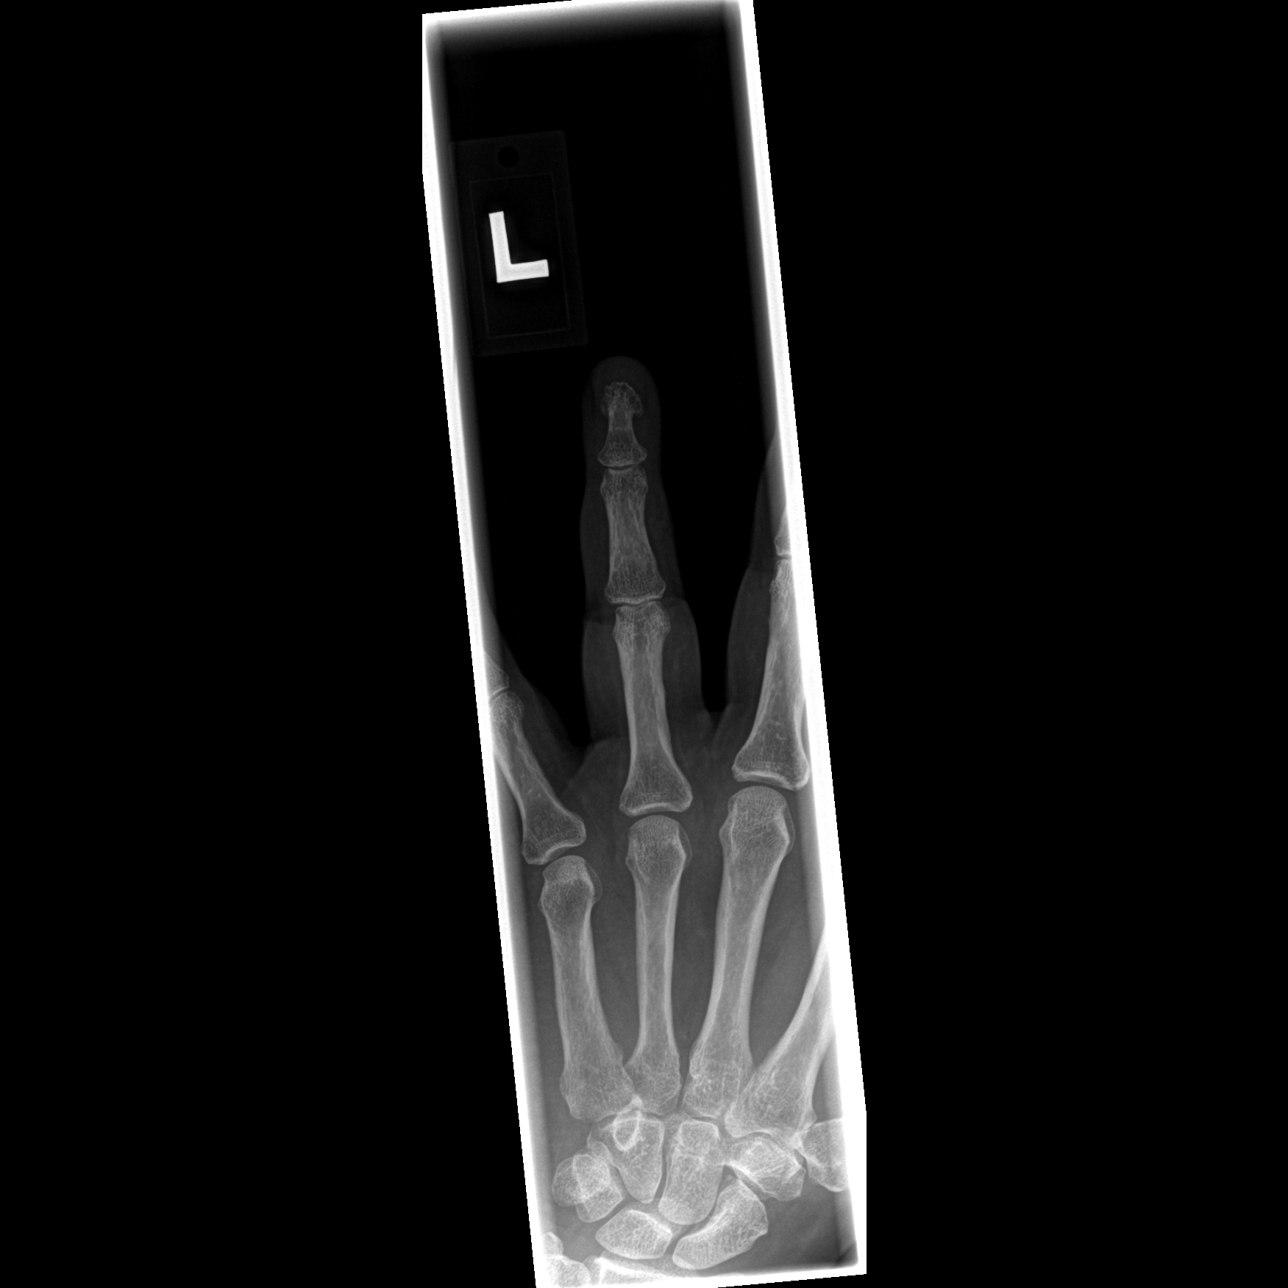

[x finger obl. left]
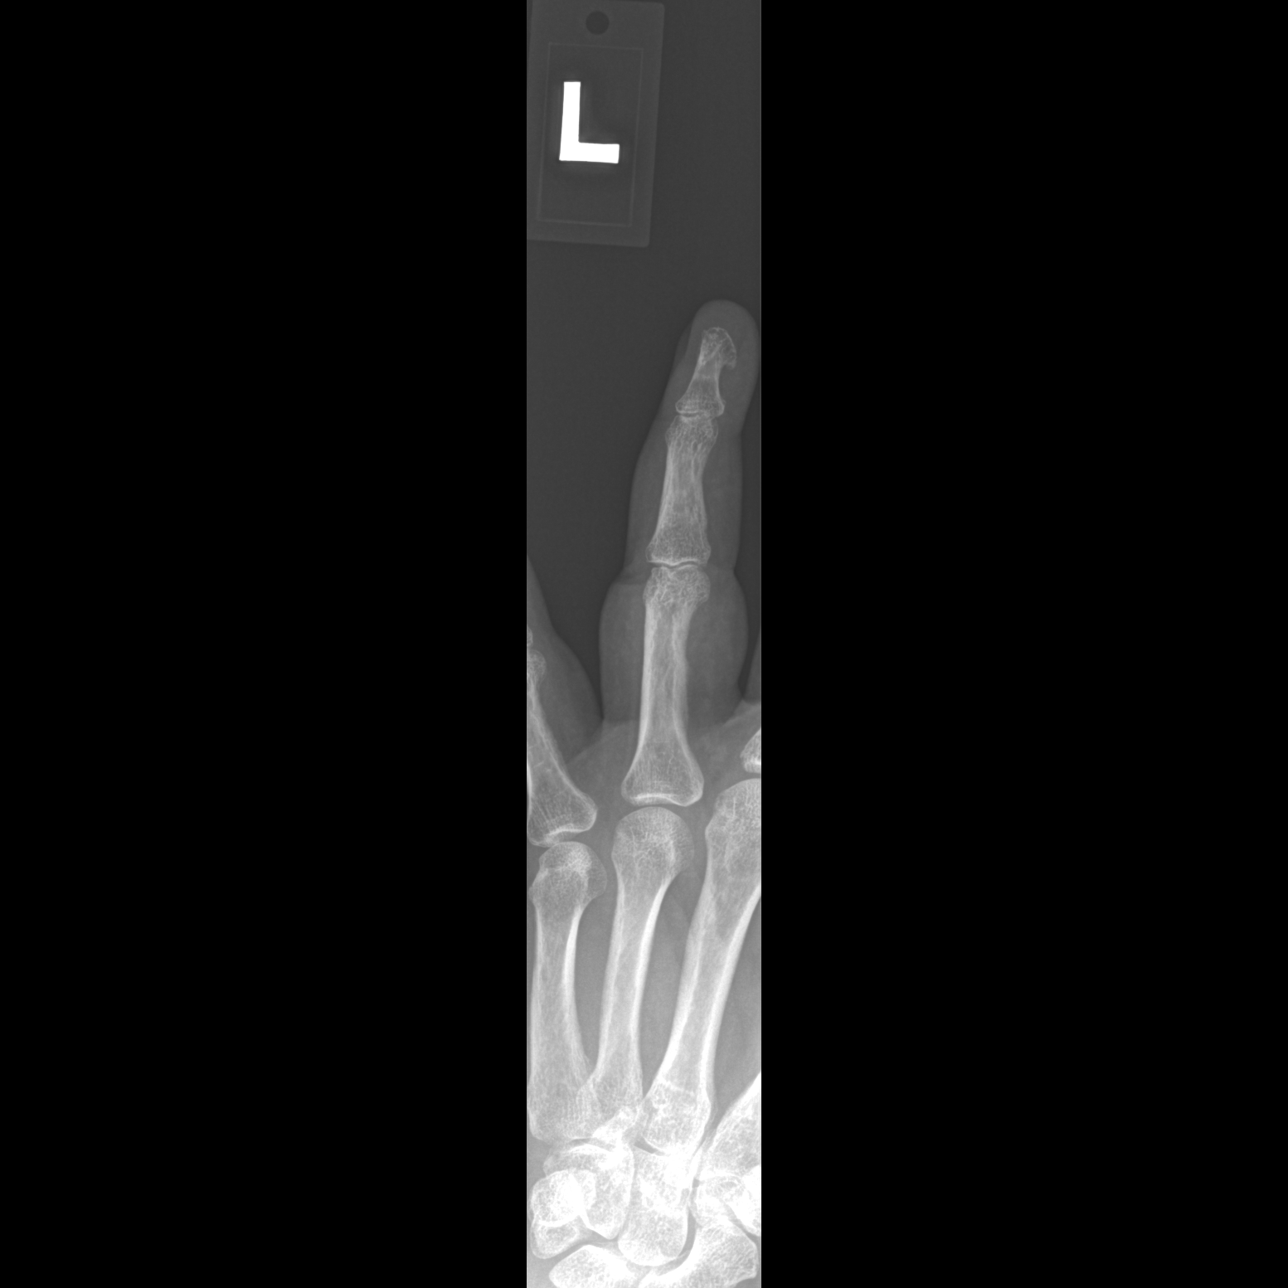

[x finger lateral left]
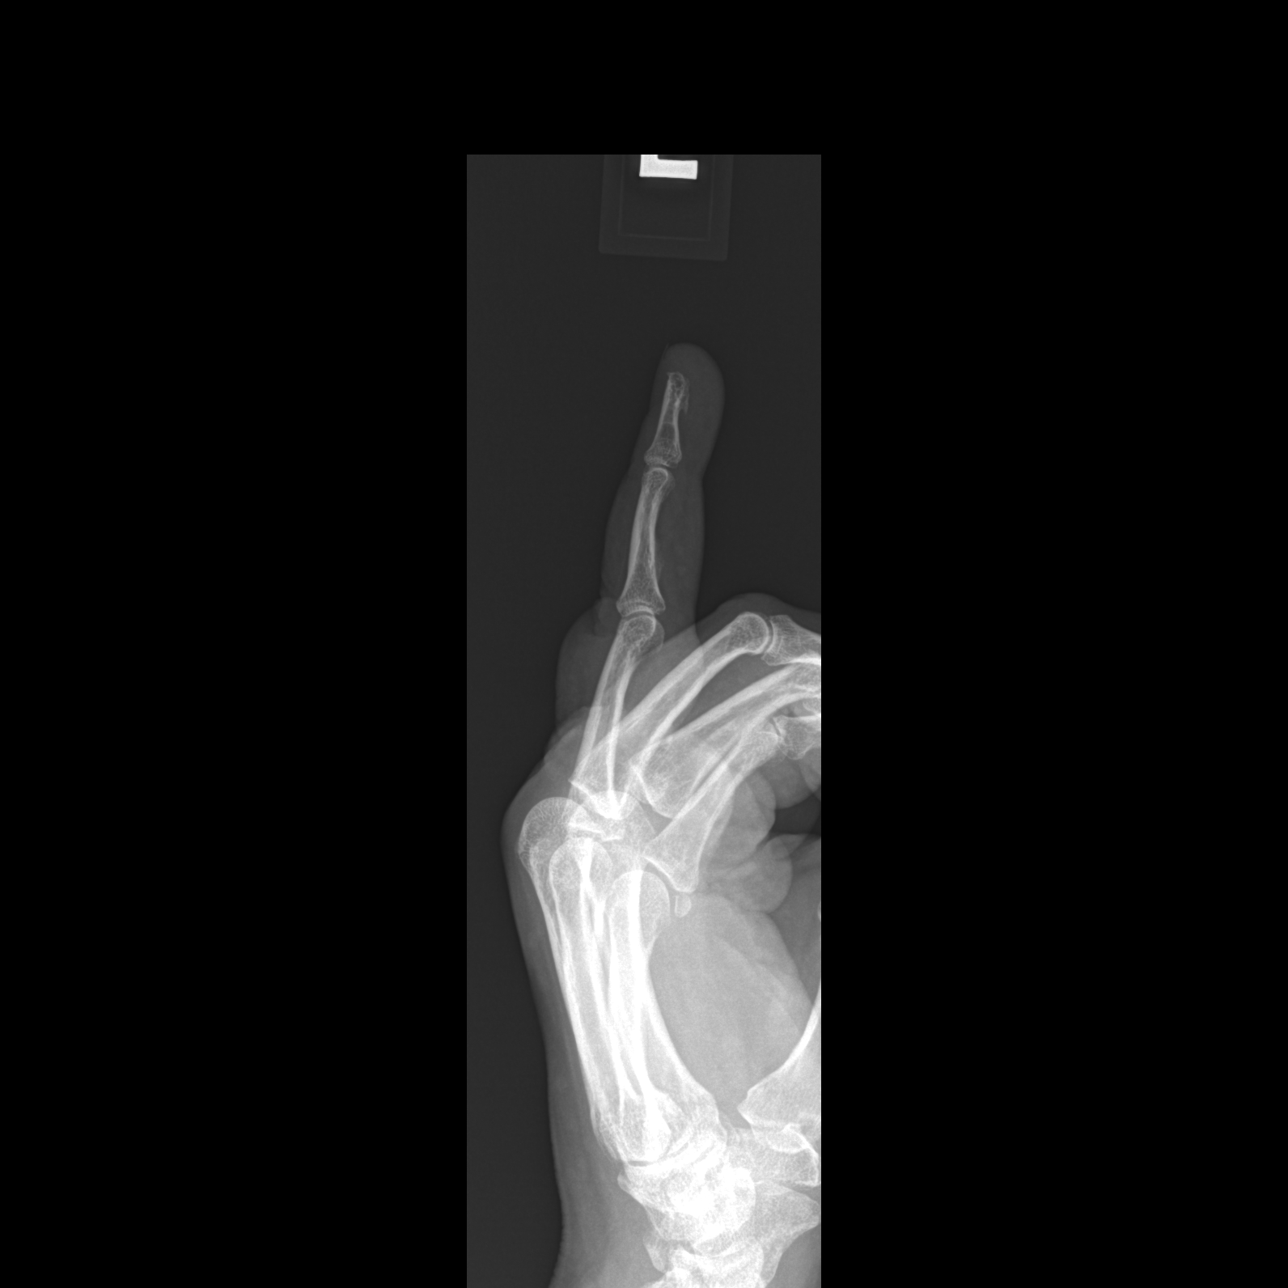

[3 of 3 positions shown; findings below may reference images not displayed]

FINDINGS: No acute fracture or dislocation. Joint spaces and alignment are
maintained. No area of erosion or osseous destruction. No unexpected
radiopaque foreign body. Soft tissue edema of the fourth digit.
IMPRESSION: No acute fracture or dislocation.

## 2022-02-12 DIAGNOSIS — E669 Obesity, unspecified: Secondary | ICD-10-CM | POA: Diagnosis not present

## 2022-02-23 ENCOUNTER — Other Ambulatory Visit: Payer: Self-pay

## 2022-02-25 ENCOUNTER — Other Ambulatory Visit: Payer: Self-pay

## 2022-02-25 MED ORDER — ATORVASTATIN CALCIUM 10 MG PO TABS
10.0000 mg | ORAL_TABLET | Freq: Every day | ORAL | 1 refills | Status: DC
  Filled 2022-02-25: qty 90, 90d supply, fill #0
  Filled 2022-05-04: qty 90, 90d supply, fill #1

## 2022-03-11 DIAGNOSIS — Z1231 Encounter for screening mammogram for malignant neoplasm of breast: Secondary | ICD-10-CM | POA: Diagnosis not present

## 2022-03-18 ENCOUNTER — Encounter: Payer: Self-pay | Admitting: Nurse Practitioner

## 2022-03-18 ENCOUNTER — Ambulatory Visit (INDEPENDENT_AMBULATORY_CARE_PROVIDER_SITE_OTHER): Payer: Commercial Managed Care - PPO | Admitting: Nurse Practitioner

## 2022-03-18 ENCOUNTER — Other Ambulatory Visit: Payer: Self-pay | Admitting: Nurse Practitioner

## 2022-03-18 ENCOUNTER — Other Ambulatory Visit (HOSPITAL_COMMUNITY)
Admission: RE | Admit: 2022-03-18 | Discharge: 2022-03-18 | Disposition: A | Discharge status: Home / Self Care | Payer: Commercial Managed Care - PPO | Source: Ambulatory Visit | Attending: Nurse Practitioner | Admitting: Nurse Practitioner

## 2022-03-18 VITALS — BP 110/72 | HR 68 | Ht 63.0 in | Wt 206.0 lb

## 2022-03-18 DIAGNOSIS — Z124 Encounter for screening for malignant neoplasm of cervix: Secondary | ICD-10-CM | POA: Diagnosis not present

## 2022-03-18 DIAGNOSIS — N952 Postmenopausal atrophic vaginitis: Secondary | ICD-10-CM

## 2022-03-18 DIAGNOSIS — N941 Unspecified dyspareunia: Secondary | ICD-10-CM

## 2022-03-18 DIAGNOSIS — Z01419 Encounter for gynecological examination (general) (routine) without abnormal findings: Secondary | ICD-10-CM

## 2022-03-18 MED ORDER — ESTRADIOL 0.1 MG/GM VA CREA: 1.0000 g | TOPICAL_CREAM | VAGINAL | 2 refills | Status: DC

## 2022-03-18 NOTE — Progress Notes (Signed)
Holly Wolfe 1965-12-08 [id number]   History:  57 y.o. G0 presents for annual exam. Postmenopausal. Complains of painful intercourse and dryness. Was prescribed vaginal estrogen last year but never started. She has tried coconut oil for lubrication with little improvement.  Normal pap and mammogram history.   Gynecologic History Patient's last menstrual period was 11/25/2017.   Contraception: post menopausal status Sexually active: Yes  Health maintenance Last Pap: 12/12/2017. Results were: Normal, 5-year repeat Last mammogram: 03/11/2022. Results were: Normal Last colonoscopy: 07/12/2015. Results were: Normal, 10-year recall Last Dexa: Not indicated  Past medical history, past surgical history, family history and social history were all reviewed and documented in the EPIC chart. 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 Husband works in Careers information officer for [employer] was performed and pertinent positives and negatives are included.  Exam:  Vitals:   03/18/22 0953  BP: 110/72  Pulse: 68  SpO2: 100%  Weight: 206 lb (93.4 kg)  Height: 5' 3"$  (1.6 m)     Body mass index is 36.49 kg/m.  General appearance:  Normal Thyroid:  Symmetrical, normal in size, without palpable masses or nodularity. Respiratory  Auscultation:  Clear without wheezing or rhonchi Cardiovascular  Auscultation:  Regular rate, without rubs, murmurs or gallops  Edema/varicosities:  Not grossly evident Abdominal  Soft,nontender, without masses, guarding or rebound.  Liver/spleen:  No organomegaly noted  Hernia:  None appreciated  Skin  Inspection:  Grossly normal   Breasts: Examined lying and sitting.   Right: Without masses, retractions, discharge or axillary adenopathy.   Left: Without masses, retractions, discharge or axillary adenopathy. Genitourinary   Inguinal/mons:  Normal without inguinal adenopathy  External genitalia:  Normal appearing vulva with no masses, tenderness, or  lesions  BUS/Urethra/Skene's glands:  Normal  Vagina:  Normal appearing with normal color and discharge, no lesions. Atrophic changes  Cervix:  Normal appearing without discharge or lesions  Uterus:  Normal in size, shape and contour.  Midline and mobile, nontender  Adnexa/parametria:     Rt: Normal in size, without masses or tenderness.   Lt: Normal in size, without masses or tenderness.  Anus and perineum: Normal  Digital rectal exam: Deferred  Patient informed chaperone available to be present for breast and pelvic exam. Patient has requested no chaperone to be present. Patient has been advised what will be completed during breast and pelvic exam.   Assessment/Plan:  57 y.o. G0 for annual exam.   Well female exam with routine gynecological exam - Education provided on SBEs, importance of preventative screenings, current guidelines, high calcium diet, regular exercise, and multivitamin daily. Labs with PCP.  Screening for cervical cancer - Plan: Cytology - PAP( [place]). Normal pap history.   Postmenopausal atrophic vaginitis -  Complains of painful intercourse and dryness. Pain with exam today. Was prescribed vaginal estrogen last year but never started. She has tried coconut oil for lubrication with little improvement. She  is interested in doing vaginal estrogen. Initially she will use nightly x 1 week, then twice weekly for duration of use. She is aware of risk for small amount of systemic absorption. She has cream at home but will check expiration and let us know if she needs updated prescription.   Screening for breast cancer - Normal mammogram history.  Continue annual screenings.  Normal breast exam today.  Screening for colon cancer - 2017 colonoscopy. Will repeat screening colonoscopy at GI's recommended interval.  Screening for osteoporosis - Average  risk. Will plan for DXA at age 14.   Follow-up in 1 year for annual.     Tamela Gammon [facility], 10:15 AM 03/18/2022

## 2022-03-25 LAB — CYTOLOGY - PAP
Comment: NEGATIVE
Diagnosis: NEGATIVE
High risk HPV: NEGATIVE

## 2022-05-06 ENCOUNTER — Other Ambulatory Visit: Payer: Self-pay

## 2022-05-10 DIAGNOSIS — E781 Pure hyperglyceridemia: Secondary | ICD-10-CM | POA: Diagnosis not present

## 2022-05-10 DIAGNOSIS — D649 Anemia, unspecified: Secondary | ICD-10-CM | POA: Diagnosis not present

## 2022-05-10 DIAGNOSIS — Z79899 Other long term (current) drug therapy: Secondary | ICD-10-CM | POA: Diagnosis not present

## 2022-05-13 ENCOUNTER — Other Ambulatory Visit: Payer: Self-pay

## 2022-05-17 DIAGNOSIS — F419 Anxiety disorder, unspecified: Secondary | ICD-10-CM | POA: Diagnosis not present

## 2022-05-17 DIAGNOSIS — Z79899 Other long term (current) drug therapy: Secondary | ICD-10-CM | POA: Diagnosis not present

## 2022-05-17 DIAGNOSIS — E669 Obesity, unspecified: Secondary | ICD-10-CM | POA: Diagnosis not present

## 2022-05-17 DIAGNOSIS — Z1331 Encounter for screening for depression: Secondary | ICD-10-CM | POA: Diagnosis not present

## 2022-05-17 DIAGNOSIS — E7800 Pure hypercholesterolemia, unspecified: Secondary | ICD-10-CM | POA: Diagnosis not present

## 2022-05-17 DIAGNOSIS — Z0000 Encounter for general adult medical examination without abnormal findings: Secondary | ICD-10-CM | POA: Diagnosis not present

## 2022-08-14 ENCOUNTER — Ambulatory Visit: Payer: Managed Care, Other (non HMO) | Admitting: Sports Medicine

## 2022-08-14 VITALS — BP 112/84 | Ht 64.0 in | Wt 200.0 lb

## 2022-08-14 DIAGNOSIS — M25561 Pain in right knee: Secondary | ICD-10-CM

## 2022-08-14 MED ORDER — DICLOFENAC SODIUM 75 MG PO TBEC: DELAYED_RELEASE_TABLET | ORAL | 0 refills | Status: DC

## 2022-08-14 NOTE — Progress Notes (Signed)
   Subjective:    Patient ID: Holly Wolfe, female    DOB: [birth year], 57 y.o.   MRN: [id number]  HPI chief complaint: Right knee pain  [name] presents today complaining of 1 week of medial sided right knee pain.  She has recently started an exercise program which involves a lot of walking and aerobics.  She has been gradual about increasing the number of days that she exercises.  She is approximately at 4 to 5 days a week now.  No known trauma.  Her pain is present primarily after exercise.  She denies any swelling.  No mechanical symptoms.  She has tried over-the-counter ibuprofen without any benefit.  She also notices pain with coming downstairs.  She has had problems with her left knee in the past and has received cortisone injections and viscosupplementation with good results.  Interim medical history reviewed Medication reviewed Allergies reviewed    Review of Systems As above    Objective:   Physical Exam  Well-developed, well-nourished.  No acute distress  Right knee: Full range of motion.  No effusion.  There is some tenderness to palpation along the medial joint line with an equivocal Thessaly's.  No tenderness along the lateral joint line.  Negative McMurray's.  Knee is grossly stable to ligamentous exam.  Neurovascularly intact distally.      Assessment & Plan:   1 week of right knee pain likely secondary to medial compartmental DJD versus degenerative meniscal tear  I recommended a compression sleeve with exercise.  She will ice postexercise.  She also will avoid lunges and may need to back down to 3 to 4 days a week depending on her response to today's treatment.  She has tried meloxicam in the past without any relief so we will trial Voltaren 75 mg twice daily with food for 5 days.  She may then wean to taking it only as needed.  She is cautioned about GI upset.  We will schedule a tentative follow-up for August 13 as she has a trip coming up on August 17.  I have agreed  to inject her knee with cortisone at that visit if her symptoms have not resolved.  I have also agreed to inject her knee sooner than that if symptoms worsen in the interim.  This note was dictated using Dragon naturally speaking software and may contain errors in syntax, spelling, or content which have not been identified prior to signing this note.

## 2022-08-24 ENCOUNTER — Other Ambulatory Visit: Payer: Self-pay

## 2022-08-26 ENCOUNTER — Other Ambulatory Visit: Payer: Self-pay

## 2022-08-26 MED ORDER — ATORVASTATIN CALCIUM 10 MG PO TABS
10.0000 mg | ORAL_TABLET | Freq: Every day | ORAL | 1 refills | Status: DC
  Filled 2022-08-26: qty 90, 90d supply, fill #0
  Filled 2022-11-20: qty 90, 90d supply, fill #1

## 2022-09-17 ENCOUNTER — Ambulatory Visit: Payer: Managed Care, Other (non HMO) | Admitting: Sports Medicine

## 2022-09-23 ENCOUNTER — Encounter: Payer: Self-pay | Admitting: Nurse Practitioner

## 2022-10-07 ENCOUNTER — Encounter: Payer: Self-pay | Admitting: Nurse Practitioner

## 2022-10-08 NOTE — Telephone Encounter (Signed)
Last AEX 03/18/22 -postmenopausal atrophic vaginitis.   Rx sent on 03/18/22 -[facility]  Pt notified.   Routing to [employer].   Encounter closed.

## 2022-11-28 ENCOUNTER — Ambulatory Visit: Payer: Self-pay | Admitting: Podiatry

## 2023-01-20 ENCOUNTER — Other Ambulatory Visit: Payer: Self-pay

## 2023-01-20 ENCOUNTER — Encounter: Payer: Self-pay | Admitting: Family Medicine

## 2023-01-20 ENCOUNTER — Other Ambulatory Visit: Payer: Self-pay | Admitting: Family Medicine

## 2023-01-20 ENCOUNTER — Ambulatory Visit: Payer: Managed Care, Other (non HMO) | Admitting: Family Medicine

## 2023-01-20 VITALS — BP 100/75 | Ht 63.0 in | Wt 200.0 lb

## 2023-01-20 DIAGNOSIS — M7662 Achilles tendinitis, left leg: Secondary | ICD-10-CM

## 2023-01-20 DIAGNOSIS — M25572 Pain in left ankle and joints of left foot: Secondary | ICD-10-CM | POA: Diagnosis not present

## 2023-01-20 DIAGNOSIS — M7672 Peroneal tendinitis, left leg: Secondary | ICD-10-CM | POA: Diagnosis not present

## 2023-01-20 MED ORDER — NITROGLYCERIN 0.2 MG/HR TD PT24: MEDICATED_PATCH | TRANSDERMAL | 1 refills | Status: AC

## 2023-01-20 NOTE — Patient Instructions (Signed)
You have Achilles Tendinopathy and a partial tear, along with peroneal tendonitis -Do home exercises daily as directed. Icing 15 minutes at a time 3-4 times a day. Avoid uneven ground, hills as much as possible. Avoid flat shoes, barefoot walking. Consider physical therapy, nitro patches, shockwave therapy if not improving as expected. Follow up in 6 weeks.   Nitroglycerin Protocol  Apply 1/4 nitroglycerin patch to affected area daily. Change position of patch within the affected area every 24 hours. You may experience a headache during the first 1-2 weeks of using the patch, these should subside. If you experience headaches after beginning nitroglycerin patch treatment, you may take your preferred over the counter pain reliever. Another side effect of the nitroglycerin patch is skin irritation or rash related to patch adhesive. Please notify our office if you develop more severe headaches or rash, and stop the patch. Tendon healing with nitroglycerin patch may require 12 to 24 weeks depending on the extent of injury. Men should not use if taking Viagra, Cialis, or Levitra.  Do not use if you have migraines or rosacea.

## 2023-01-20 NOTE — Progress Notes (Signed)
DATE OF VISIT: 01/20/2023        Holly Wolfe DOB: 21-May-1965 MRN: [id number]  CC:  LT ankle pain  History of present Illness: Holly Wolfe is a 57 y.o. female who presents for evaluation of Lt ankle pain  Pain x 1 month Thinks injured while doing side to side motions with exercise No specific swelling or bruising Worse with walking >1-2 miles Worse with standing for long periods Some night pain Occ numbness/tingling  Increased posterior pain with going down the stairs No improvement with ice and Epsom salts Pain along lateral and posterior ankle No improvement with NSAIDs Tried camboot without improvement - tried x 2-3 days without improvement  Previously seen by [facility] - Dr Darrick Penna 01/2017 for kickboxing injury, then had f/u with Dr Margaretha Sheffield for Lt ankle pain in early 2019 - MRI at that time showing: IMPRESSION: 1. Mild peroneal tenosynovitis. 2.  No acute osseous injury of the left ankle  Medications:  Outpatient Encounter Medications as of 01/20/2023  Medication Sig   nitroGLYCERIN (NITRODUR - DOSED IN MG/24 HR) 0.2 mg/hr patch Use 1/4 patch daily to the affected area.   Ascorbic Acid (VITAMIN C) 1000 MG tablet Take 1,000 mg by mouth every morning.    aspirin EC 81 MG tablet Take 81 mg by mouth every morning.    atorvastatin (LIPITOR) 10 MG tablet Take 1 tablet (10 mg total) by mouth once daily   calcium carbonate (TUMS - DOSED IN MG ELEMENTAL CALCIUM) 500 MG chewable tablet Chew 1 tablet by mouth as needed for indigestion or heartburn.   cholecalciferol (VITAMIN D) 1000 UNITS tablet Take 5,000 Units by mouth every morning.    diclofenac (VOLTAREN) 75 MG EC tablet Take 1 tablet (75 mg) twice daily with food for 5 days. Then take as needed.   estradiol (ESTRACE VAGINAL) 0.1 MG/GM vaginal cream Place 1 g vaginally 2 (two) times a week. Initial dose: Nightly x 1 week, then twice weekly   ferrous sulfate 325 (65 FE) MG tablet Take 325 mg by mouth 2 (two) times a week. Takes in  am   fluticasone (FLONASE) 50 MCG/ACT nasal spray Place 2 sprays into both nostrils 2 (two) times daily as needed.    loratadine (CLARITIN) 10 MG tablet Take 10 mg by mouth every morning.   Multiple Vitamins-Minerals (MULTIVITAMIN WITH MINERALS) tablet Take 1 tablet by mouth every morning.   omeprazole (PRILOSEC) 10 MG capsule Take 10 mg by mouth daily.   valACYclovir (VALTREX) 1000 MG tablet Take 1 tablet (1,000 mg total) by mouth 2 (two) times daily. For 3-5 days at first sign of outbreak   [DISCONTINUED] escitalopram (LEXAPRO) 10 MG tablet Take 1.5 tablets (15 mg total) by mouth daily.   Facility-Administered Encounter Medications as of 01/20/2023  Medication   methylPREDNISolone acetate (DEPO-MEDROL) injection 40 mg    Allergies: has no known allergies.  Physical Examination: Vitals: BP 100/75   Ht 5\' 3"  (1.6 m)   Wt 200 lb (90.7 kg)   LMP 11/25/2017   BMI 35.43 kg/m  GENERAL:  Holly Wolfe is a 57 y.o. female appearing their stated age, alert and oriented x 3, in no apparent distress.  SKIN: no rashes or lesions, skin clean, dry, intact MSK: Left foot and ankle with mild lateral soft tissue swelling over the sinus tarsi, as well as the lateral midfoot.  No increased redness or warmth.  She is mildly tender to palpation over the distal Achilles, as well as the distal peroneal tendons.  She has full range of motion of the ankle with pain at terminal eversion.  Ankle strength 5 -/5 throughout, has pain with resisted eversion.  Negative anterior drawer, negative talar tilt Right foot and ankle with swelling over the sinus tarsi.  No increased tenderness in the midfoot, forefoot, hindfoot, or throughout the ankle.  Full range of motion without pain.  Ankle strength 5/5 throughout. Bilateral pes planus (Rt>Lt) NEURO: sensation intact to light touch lower extremity bilaterally VASC: pulses 2+ and symmetric PT/PT bilaterally, no edema  Radiology: Limited MSK ultrasound left  ankle Date: 01/20/2023 Indication: Left ankle pain rule out Achilles or other soft tissue injury Findings: -Achilles with hypoechoic change at the insertion, small areas of partial tearing noted.  Increased Doppler flow appreciated.  Thickening of the Achilles tendon also noted. -Peroneal tendons with increased anechoic fluid in the tendon sheath just distal to the lateral malleolus.  Small areas of intrasubstance hypoechoic change consistent with tendinopathy and likely small intrasubstance tear.  Small area of increased Doppler flow. -Small amount of increased fluid noted in the ankle joint, no increased Doppler flow -Normal-appearing medial and lateral talar dome  Impression: 1.  Insertional Achilles tendinopathy with small area of partial tearing 2.  Peroneal tendinopathy with small area of intrasubstance tearing  Images and interpretation completed by Darene Lamer, DO today   MRI LT ANKLE 05/07/2017 showing: IMPRESSION: 1. Mild peroneal tenosynovitis. 2.  No acute osseous injury of the left ankle  LT ANKLE XR 04/30/2017 showing: IMPRESSION: No acute or old fracture of the left ankle is observed. Specific attention to the anterior aspect of the talus reveals no acute bony abnormality.  Ultrasound of Left lateral ankle 01/23/2017 by Dr Darrick Penna showing:  No joint effusion Talus appears normal Small bone fragment noted anterior to the cortex and just below this portion of the distal lateral malleolus This has hypoechoic change ATF ligament appears intact   Impression: Small bone chip noted anterior to lateral maolleolus   Ultrasound and interpretation by Sibyl Parr. Fields, MD  Assessment & Plan Left ankle pain, unspecified chronicity Left ankle pain with insertional Achilles tendinopathy and peroneal tendinopathy.  Please see plan as noted below Insertional tendinopathy of left Achilles tendon MSK ultrasound with Achilles tendinopathy and partial tearing at the  insertion  Plan: -Ultrasound findings reviewed with patient.  Diagnosis and treatment discussed -Recommend nitroglycerin protocol.Rx Nitroglycerin patch 0.1mg  - apply 1/4 patch to left Achilles, leave in place for 24-hrs, change daily.  Counseled on side effects including headache & rash from adhesive.  Should stop if having side effects & notify office.  And use Tylenol as needed for headache -Instructed on home exercise program with eccentric heel drop program -Should wear supportive shoes and avoid walking around barefoot -Follow-up 6 weeks for reevaluation, sooner as needed  Peroneal tendonitis, left MSK ultrasound today showing peroneal tendinopathy with partial intrasubstance tearing  Plan: -Ultrasound findings reviewed with patient.  Diagnosis and treatment discussed -Instructed on home exercise program with eccentric heel drop program -Should wear supportive shoes and avoid walking around barefoot -Follow-up 6 weeks for reevaluation, sooner as needed  Patient expressed understanding & agreement with above.  Encounter Diagnoses  Name Primary?   Left ankle pain, unspecified chronicity Yes   Insertional tendinopathy of left Achilles tendon    Peroneal tendonitis, left     Orders Placed This Encounter  Procedures   [place] LIMITED JOINT SPACE STRUCTURES LOW LEFT

## 2023-02-13 ENCOUNTER — Encounter: Payer: Self-pay | Admitting: Family Medicine

## 2023-02-13 ENCOUNTER — Ambulatory Visit: Payer: Managed Care, Other (non HMO) | Admitting: Family Medicine

## 2023-02-13 VITALS — BP 122/79 | Ht 63.0 in | Wt 200.0 lb

## 2023-02-13 DIAGNOSIS — M7672 Peroneal tendinitis, left leg: Secondary | ICD-10-CM | POA: Diagnosis not present

## 2023-02-13 DIAGNOSIS — M7662 Achilles tendinitis, left leg: Secondary | ICD-10-CM

## 2023-02-13 MED ORDER — PREDNISONE 10 MG PO TABS: ORAL_TABLET | ORAL | 0 refills | Status: AC

## 2023-02-13 NOTE — Progress Notes (Signed)
 DATE OF VISIT: 02/13/2023        Holly Wolfe DOB: 22-Apr-1965 MRN: [id number]  CC:  f/u Lt ankle  History of present Illness: Holly Wolfe is a 58 y.o. female who presents for a follow-up visit for left ankle pain Last seen by me 01/20/23 and dx with achilles tendinopathy and peroneal tendinopathy - was started on topical nitroglycerin  therapy and given HEP  Since last visit she reports pain is unchanged - pain along achilles and lateral foot Has been using nitroglycerin  patches Has been doing HEP Worse with walking and daily activities Some mild swelling  Has trip to [place] in March and is planning a lot of walking  Medications:  Outpatient Encounter Medications as of 02/13/2023  Medication Sig   predniSONE  (DELTASONE ) 10 MG tablet Take as directed per MD instructions   Ascorbic Acid (VITAMIN C) 1000 MG tablet Take 1,000 mg by mouth every morning.    aspirin EC 81 MG tablet Take 81 mg by mouth every morning.    atorvastatin  (LIPITOR) 10 MG tablet Take 1 tablet (10 mg total) by mouth once daily   calcium  carbonate (TUMS - DOSED IN MG ELEMENTAL CALCIUM ) 500 MG chewable tablet Chew 1 tablet by mouth as needed for indigestion or heartburn.   cholecalciferol (VITAMIN D) 1000 UNITS tablet Take 5,000 Units by mouth every morning.    diclofenac  (VOLTAREN ) 75 MG EC tablet Take 1 tablet (75 mg) twice daily with food for 5 days. Then take as needed.   estradiol  (ESTRACE  VAGINAL) 0.1 MG/GM vaginal cream Place 1 g vaginally 2 (two) times a week. Initial dose: Nightly x 1 week, then twice weekly   ferrous sulfate 325 (65 FE) MG tablet Take 325 mg by mouth 2 (two) times a week. Takes in am   fluticasone (FLONASE) 50 MCG/ACT nasal spray Place 2 sprays into both nostrils 2 (two) times daily as needed.    loratadine (CLARITIN) 10 MG tablet Take 10 mg by mouth every morning.   Multiple Vitamins-Minerals (MULTIVITAMIN WITH MINERALS) tablet Take 1 tablet by mouth every morning.   nitroGLYCERIN   (NITRODUR - DOSED IN MG/24 HR) 0.2 mg/hr patch Use 1/4 patch daily to the affected area.   omeprazole (PRILOSEC) 10 MG capsule Take 10 mg by mouth daily.   valACYclovir  (VALTREX ) 1000 MG tablet Take 1 tablet (1,000 mg total) by mouth 2 (two) times daily. For 3-5 days at first sign of outbreak   [DISCONTINUED] escitalopram  (LEXAPRO ) 10 MG tablet Take 1.5 tablets (15 mg total) by mouth daily.   Facility-Administered Encounter Medications as of 02/13/2023  Medication   methylPREDNISolone  acetate (DEPO-MEDROL ) injection 40 mg    Allergies: has no known allergies.  Physical Examination: Vitals: BP 122/79   Ht 5' 3 (1.6 m)   Wt 200 lb (90.7 kg)   LMP 11/25/2017   BMI 35.43 kg/m  GENERAL:  Holly Wolfe is a 58 y.o. female appearing their stated age, alert and oriented x 3, in no apparent distress.  SKIN: no rashes or lesions, skin clean, dry, intact MSK: Left foot and ankle with mild lateral soft tissue swelling over the sinus tarsi and lateral midfoot.  No increased redness or warmth.  No bruising.  Mild tenderness to palpation over distal Achilles at insertion on calcaneus.  Mild tenderness along distal peroneal tendons as well.  Has full range of motion of the ankle with mild pain today.  Ankle strength 5-/5 throughout.  Negative anterior drawer, negative talar tilt. Neurovascularly intact distally  Assessment &  Plan Peroneal tendonitis, left Ongoing left posterior and lateral ankle pain with insertional Achilles tendinopathy and peroneal tendinopathy  Plan: -Explained to patient that this condition can take weeks to months to improve.  She has a slightly truncated timeline as she is taking trip to [place] in March and plans to do a lot of walking -Discussed treatment options in detail including injection therapy, p.o. steroids, formal physical therapy, ongoing treatment with topical nitroglycerin , shockwave therapy, PRP.  She is not interested in shockwave therapy or PRP due to cost.   She would be interested in trial of oral steroids, as well as formal physical therapy -Prescription for 6-day prednisone  Dosepak provided.  Should take with food.  Should not take with any other oral NSAIDs -She will continue her topical nitroglycerin .  She states she is currently using 1 full patch and not experiencing any side effects.  She will continue with his regimen -Given physical therapy referral to Holly Wolfe and Holly Wolfe. -She should continue with her home exercises -Follow-up 3 to 4 weeks for reevaluation, sooner as needed.  Could consider possible MRI at that time if needed Insertional tendinopathy of left Achilles tendon Ongoing left posterior and lateral ankle pain with insertional Achilles tendinopathy and peroneal tendinopathy  Plan: -Explained to patient that this condition can take weeks to months to improve.  She has a slightly truncated timeline as she is taking trip to [place] in March and plans to do a lot of walking -Discussed treatment options in detail including injection therapy, p.o. steroids, formal physical therapy, ongoing treatment with topical nitroglycerin , shockwave therapy, PRP.  She is not interested in shockwave therapy or PRP due to cost.  She would be interested in trial of oral steroids, as well as formal physical therapy -Prescription for 6-day prednisone  Dosepak provided.  Should take with food.  Should not take with any other oral NSAIDs -She will continue her topical nitroglycerin .  She states she is currently using 1 full patch and not experiencing any side effects.  She will continue with his regimen -Given physical therapy referral to Holly Wolfe and Holly Wolfe. -She should continue with her home exercises -Also discussed supportive footwear.  [employer] is opening a [employer] for tomorrow.  She does plan to go there to get new shoes, as well as possibly new inserts.  I think this would be helpful and beneficial for  her. -Follow-up 3 to 4 weeks for reevaluation, sooner as needed.  Could consider possible MRI at that time if needed Patient expressed understanding & agreement with above.  Encounter Diagnoses  Name Primary?   Peroneal tendonitis, left Yes   Insertional tendinopathy of left Achilles tendon     Orders Placed This Encounter  Procedures   Ambulatory referral to Physical Therapy

## 2023-02-14 ENCOUNTER — Ambulatory Visit (INDEPENDENT_AMBULATORY_CARE_PROVIDER_SITE_OTHER): Payer: Self-pay | Admitting: Family Medicine

## 2023-02-14 ENCOUNTER — Encounter: Payer: Self-pay | Admitting: Family Medicine

## 2023-02-14 DIAGNOSIS — M7662 Achilles tendinitis, left leg: Secondary | ICD-10-CM

## 2023-02-14 DIAGNOSIS — M7672 Peroneal tendinitis, left leg: Secondary | ICD-10-CM

## 2023-02-14 NOTE — Progress Notes (Signed)
 Patient returns for shockwave treatment for her left achilles tendinopathy and peroneal tendinopathy. Feels improved compared to yesterday having started prednisone . Easier to get up and down stairs. Discussed risks of shockwave.  Procedure: ECSWT Indications:  left achilles and peroneal tendin    Procedure Details Consent: Risks of procedure as well as the alternatives and risks of each were explained to the patient.  Written consent for procedure obtained. Time Out: Verified patient identification, verified procedure, site was marked, verified correct patient position, medications/allergies/relevent history reviewed.  The area was cleaned with alcohol swab.     The left achilles and peroneal tendons were targeted for Extracorporeal shockwave therapy.    Preset: achillodynia Power Level: 70 Frequency: 10 Impulse/cycles: 2000 (~1250 achilles, 750 peroneals Head size: large   Patient tolerated procedure well without immediate complications Follow up in 1 week for second treatment.

## 2023-02-20 ENCOUNTER — Ambulatory Visit (INDEPENDENT_AMBULATORY_CARE_PROVIDER_SITE_OTHER): Payer: Self-pay | Admitting: Family Medicine

## 2023-02-20 DIAGNOSIS — M7662 Achilles tendinitis, left leg: Secondary | ICD-10-CM

## 2023-02-20 DIAGNOSIS — M7672 Peroneal tendinitis, left leg: Secondary | ICD-10-CM

## 2023-02-20 NOTE — Progress Notes (Signed)
Patient returns for her second shockwave treatment. She reports improvement since last visit, feels like it's helping her. No bruising or side effects from first treatment.  Procedure: ECSWT Indications:  left achilles tendinopathy, peroneal tendinopathy   Procedure Details Consent: Risks of procedure as well as the alternatives and risks of each were explained to the patient.  Written consent for procedure obtained. Time Out: Verified patient identification, verified procedure, site was marked, verified correct patient position, medications/allergies/relevent history reviewed.  The area was cleaned with alcohol swab.     The left achilles and peroneal tendons were targeted for Extracorporeal shockwave therapy.    Preset: achillodynia Power Level: 80 Frequency: 12 Impulse/cycles: 2000 (1250 achilles, 750 peroneals) Head size: large   Patient tolerated procedure well without immediate complications Follow up in 1 week for third treatment.

## 2023-02-22 ENCOUNTER — Other Ambulatory Visit: Payer: Self-pay

## 2023-02-23 ENCOUNTER — Other Ambulatory Visit: Payer: Self-pay

## 2023-02-24 ENCOUNTER — Other Ambulatory Visit: Payer: Self-pay

## 2023-02-24 MED ORDER — ATORVASTATIN CALCIUM 10 MG PO TABS
10.0000 mg | ORAL_TABLET | Freq: Every day | ORAL | 1 refills | Status: AC
  Filled 2023-02-24: qty 90, 90d supply, fill #0
  Filled 2023-05-23: qty 90, 90d supply, fill #1

## 2023-02-27 ENCOUNTER — Ambulatory Visit (INDEPENDENT_AMBULATORY_CARE_PROVIDER_SITE_OTHER): Payer: Self-pay | Admitting: Family Medicine

## 2023-02-27 DIAGNOSIS — M7662 Achilles tendinitis, left leg: Secondary | ICD-10-CM

## 2023-02-27 DIAGNOSIS — M7672 Peroneal tendinitis, left leg: Secondary | ICD-10-CM

## 2023-02-28 ENCOUNTER — Encounter: Payer: Self-pay | Admitting: Family Medicine

## 2023-02-28 NOTE — Progress Notes (Signed)
Patient returns for third shockwave treatment. Still feels like she's improving.  Procedure: ECSWT Indications:  left achilles tendinopathy, peroneal tendinopathy   Procedure Details Consent: Risks of procedure as well as the alternatives and risks of each were explained to the patient.  Written consent for procedure obtained. Time Out: Verified patient identification, verified procedure, site was marked, verified correct patient position, medications/allergies/relevent history reviewed.  The area was cleaned with alcohol swab.     The left achilles and peroneal tendons were targeted for Extracorporeal shockwave therapy.    Preset: achillodynia Power Level: 90 Frequency: 12 Impulse/cycles: 2000 (1250 achilles, 750 peroneals) Head size: large   Patient tolerated procedure well without immediate complications

## 2023-03-03 ENCOUNTER — Ambulatory Visit: Payer: Managed Care, Other (non HMO) | Admitting: Family Medicine

## 2023-03-04 ENCOUNTER — Encounter: Payer: Self-pay | Admitting: Family Medicine

## 2023-03-05 ENCOUNTER — Other Ambulatory Visit: Payer: Self-pay

## 2023-03-05 MED ORDER — DICLOFENAC SODIUM 75 MG PO TBEC: 75.0000 mg | DELAYED_RELEASE_TABLET | Freq: Two times a day (BID) | ORAL | 1 refills | Status: DC | PRN

## 2023-03-06 ENCOUNTER — Ambulatory Visit: Payer: Managed Care, Other (non HMO) | Admitting: Family Medicine

## 2023-03-06 ENCOUNTER — Ambulatory Visit: Payer: Self-pay | Admitting: Family Medicine

## 2023-03-06 ENCOUNTER — Encounter: Payer: Self-pay | Admitting: Family Medicine

## 2023-03-06 VITALS — BP 114/86 | Ht 63.0 in | Wt 200.0 lb

## 2023-03-06 DIAGNOSIS — M7672 Peroneal tendinitis, left leg: Secondary | ICD-10-CM

## 2023-03-06 DIAGNOSIS — M7662 Achilles tendinitis, left leg: Secondary | ICD-10-CM

## 2023-03-06 NOTE — Progress Notes (Signed)
DATE OF VISIT: 03/06/2023        Holly Wolfe DOB: [birth year] MRN: [id number]  CC:  f/u left achilles and peroneals  History of present Illness: Holly Wolfe is a 58 y.o. female who presents for a follow-up visit  Last seen by me 02/13/23 Started Shockwave therapy on the area 02/14/23 - completed 3 shockwave sessions - she is unsure if shockwave has been helpful, would like to pause this Had flare of anterior-lateral foot/ankle pain earlier this week - had friends in town and was on her feet a lot over the weekend - given Rx Voltaren 75mg  PO bid prn and has been helpful - still icing Feeling better today  Medications:  Outpatient Encounter Medications as of 03/06/2023  Medication Sig   Ascorbic Acid (VITAMIN C) 1000 MG tablet Take 1,000 mg by mouth every morning.    aspirin EC 81 MG tablet Take 81 mg by mouth every morning.    atorvastatin (LIPITOR) 10 MG tablet Take 1 tablet (10 mg total) by mouth once daily   calcium carbonate (TUMS - DOSED IN MG ELEMENTAL CALCIUM) 500 MG chewable tablet Chew 1 tablet by mouth as needed for indigestion or heartburn.   cholecalciferol (VITAMIN D) 1000 UNITS tablet Take 5,000 Units by mouth every morning.    diclofenac (VOLTAREN) 75 MG EC tablet Take 1 tablet (75 mg total) by mouth 2 (two) times daily as needed.   estradiol (ESTRACE VAGINAL) 0.1 MG/GM vaginal cream Place 1 g vaginally 2 (two) times a week. Initial dose: Nightly x 1 week, then twice weekly   ferrous sulfate 325 (65 FE) MG tablet Take 325 mg by mouth 2 (two) times a week. Takes in am   fluticasone (FLONASE) 50 MCG/ACT nasal spray Place 2 sprays into both nostrils 2 (two) times daily as needed.    loratadine (CLARITIN) 10 MG tablet Take 10 mg by mouth every morning.   Multiple Vitamins-Minerals (MULTIVITAMIN WITH MINERALS) tablet Take 1 tablet by mouth every morning.   nitroGLYCERIN (NITRODUR - DOSED IN MG/24 HR) 0.2 mg/hr patch Use 1/4 patch daily to the affected area.   omeprazole  (PRILOSEC) 10 MG capsule Take 10 mg by mouth daily.   predniSONE (DELTASONE) 10 MG tablet Take as directed per MD instructions   valACYclovir (VALTREX) 1000 MG tablet Take 1 tablet (1,000 mg total) by mouth 2 (two) times daily. For 3-5 days at first sign of outbreak   [DISCONTINUED] escitalopram (LEXAPRO) 10 MG tablet Take 1.5 tablets (15 mg total) by mouth daily.   Facility-Administered Encounter Medications as of 03/06/2023  Medication   methylPREDNISolone acetate (DEPO-MEDROL) injection 40 mg    Allergies: has no known allergies.  Physical Examination: Vitals: BP 114/86   Ht 5\' 3"  (1.6 m)   Wt 200 lb (90.7 kg)   LMP 11/25/2017   BMI 35.43 kg/m  GENERAL:  Holly Wolfe is a 58 y.o. female appearing their stated age, alert and oriented x 3, in no apparent distress.  SKIN: no rashes or lesions, skin clean, dry, intact MSK: Left foot and ankle with minimal soft tissue swelling over the sinus tarsi.  No other swelling.  No increased redness or warmth.  No bruising.  No tenderness to palpation over the distal Achilles or along the peroneal tendons.  Has full range of motion without pain.  Ankle strength 5/5 today.  Negative anterior drawer, negative talar tilt. Neurovascularly intact distally  Assessment & Plan Peroneal tendonitis, left Overall improved left posterior lateral ankle pain with insertional  Achilles tendinopathy and peroneal tendinopathy -Has completed 3 sessions of shockwave therapy, she is unsure how helpful this has been -Has been doing well with Voltaren p.o. twice daily as needed and icing -Has upcoming trip to [place] in mid March where she will be in [place] and [place]  PLAN: -Continue Voltaren p.o. twice daily as needed with food -Continue icing as needed -Continue to wear good supportive footwear -Can continue to use topical nitroglycerin as she is doing -Continue home exercises -Discussed starting to do more walking, encouraged to do this on flat terrain  as much as possible -Follow-up 2 to 3 weeks to reassess, could consider another few rounds of shockwave before her trip if necessary Insertional tendinopathy of left Achilles tendon Overall improved left posterior lateral ankle pain with insertional Achilles tendinopathy and peroneal tendinopathy -Has completed 3 sessions of shockwave therapy, she is unsure how helpful this has been -Has been doing well with Voltaren p.o. twice daily as needed and icing -Has upcoming trip to [place] in mid March where she will be in [place] and [place]  PLAN: -Continue Voltaren p.o. twice daily as needed with food -Continue icing as needed -Continue to wear good supportive footwear -Can continue to use topical nitroglycerin as she is doing -Continue home exercises -Discussed starting to do more walking, encouraged to do this on flat terrain as much as possible -Follow-up 2 to 3 weeks to reassess, could consider another few rounds of shockwave before her trip if necessary   Patient expressed understanding & agreement with above.  Encounter Diagnoses  Name Primary?   Peroneal tendonitis, left Yes   Insertional tendinopathy of left Achilles tendon     No orders of the defined types were placed in this encounter.

## 2023-04-02 ENCOUNTER — Ambulatory Visit (INDEPENDENT_AMBULATORY_CARE_PROVIDER_SITE_OTHER): Payer: Managed Care, Other (non HMO) | Admitting: Nurse Practitioner

## 2023-04-02 VITALS — BP 104/72 | HR 65 | Ht 63.0 in | Wt 201.0 lb

## 2023-04-02 DIAGNOSIS — N952 Postmenopausal atrophic vaginitis: Secondary | ICD-10-CM

## 2023-04-02 DIAGNOSIS — Z01419 Encounter for gynecological examination (general) (routine) without abnormal findings: Secondary | ICD-10-CM

## 2023-04-02 DIAGNOSIS — N941 Unspecified dyspareunia: Secondary | ICD-10-CM

## 2023-04-02 NOTE — Progress Notes (Signed)
 Holly Wolfe [birth year] [id number]   History:  58 y.o. G0 presents for annual exam. Postmenopausal. Severe pain with intercourse. Prescribed vaginal estrogen but was too nervous to use and also thought going to the bathroom at time would interfere. Normal pap and mammogram history.   Gynecologic History Patient's last menstrual period was 11/25/2017.   Contraception: post menopausal status Sexually active: No  Health maintenance Last Pap: 03/18/2022. Results were: Normal neg HPV Last mammogram: 03/12/2023. Results were: Normal Last colonoscopy: 07/12/2015. Results were: Normal, 10-year recall Last Dexa: Not indicated Exercising: Yes. Walking and strength training  Smoker: no   Past medical history, past surgical history, family history and social history were all reviewed and documented in the EPIC chart. Married. Semi retired last year. Husband works in Pension scheme manager for [employer].   ROS:  A ROS was performed and pertinent positives and negatives are included.  Exam:  Vitals:   04/02/23 1029  BP: 104/72  Pulse: 65  SpO2: 100%  Weight: 201 lb (91.2 kg)  Height: 5\' 3"  (1.6 m)      Body mass index is 35.61 kg/m.  General appearance:  Normal Thyroid:  Symmetrical, normal in size, without palpable masses or nodularity. Respiratory  Auscultation:  Clear without wheezing or rhonchi Cardiovascular  Auscultation:  Regular rate, without rubs, murmurs or gallops  Edema/varicosities:  Not grossly evident Abdominal  Soft,nontender, without masses, guarding or rebound.  Liver/spleen:  No organomegaly noted  Hernia:  None appreciated  Skin  Inspection:  Grossly normal   Breasts: Examined lying and sitting.   Right: Without masses, retractions, discharge or axillary adenopathy.   Left: Without masses, retractions, discharge or axillary adenopathy. Pelvic: External genitalia:  no lesions              Urethra:  normal appearing urethra with no masses, tenderness or lesions               Bartholins and Skenes: normal                 Vagina: normal appearing vagina with normal color and discharge, no lesions. Atrophic changes              Cervix: no lesions Bimanual Exam:  Uterus:  no masses or tenderness              Adnexa: no mass, fullness, tenderness              Rectovaginal: Deferred              Anus:  normal, no lesions  Patient informed chaperone available to be present for breast and pelvic exam. Patient has requested no chaperone to be present. Patient has been advised what will be completed during breast and pelvic exam.   Assessment/Plan:  58 y.o. G0 for annual exam.   Well female exam with routine gynecological exam - Education provided on SBEs, importance of preventative screenings, current guidelines, high calcium diet, regular exercise, and multivitamin daily. Labs with PCP.  Vaginal atrophy - Prescribed vaginal estrogen but was too nervous to use and also thought going to the bathroom at time would interfere. Reassured on safety and OK to go to the bathroom with use.   Dyspareunia in female - Plans to start vaginal estrogen. Does not need refills at this time. Also provided samples of [employer] Lube.   Screening for cervical cancer - Normal pap history. Will repeat at 5-year interval per guidelines.   Screening for breast cancer -  Normal mammogram history.  Continue annual screenings.  Normal breast exam today.  Screening for colon cancer - 2017 colonoscopy. Will repeat screening colonoscopy at GI's recommended interval.  Screening for osteoporosis - Average risk. Will plan for DXA at age 38.   Return in about 1 year (around 04/01/2024) for Annual.     Olivia Mackie [facility], 10:49 AM 04/02/2023

## 2023-04-03 ENCOUNTER — Encounter: Payer: Self-pay | Admitting: Nurse Practitioner

## 2023-04-03 DIAGNOSIS — N941 Unspecified dyspareunia: Secondary | ICD-10-CM

## 2023-04-03 DIAGNOSIS — N952 Postmenopausal atrophic vaginitis: Secondary | ICD-10-CM

## 2023-04-04 NOTE — Telephone Encounter (Signed)
 Med refill request: estradiol vaginal cream Last AEX: 04/02/23 Next AEX: none Last MMG (if hormonal med) 03/12/23 normal Refill authorized: estradiol vaginal cream. Sent to provider for approval or denial as appropriate.

## 2023-04-07 MED ORDER — ESTRADIOL 0.1 MG/GM VA CREA: 1.0000 g | TOPICAL_CREAM | VAGINAL | 2 refills | Status: AC

## 2023-04-15 ENCOUNTER — Encounter: Payer: Self-pay | Admitting: Nurse Practitioner

## 2023-04-15 NOTE — Telephone Encounter (Signed)
 Symptoms have resolved.  Routing to Tiffany to advise if she recommends anything additional since patient is traveling.

## 2023-04-16 NOTE — Telephone Encounter (Signed)
 No further recommendations since symptoms have resolved.

## 2023-05-23 ENCOUNTER — Other Ambulatory Visit: Payer: Self-pay

## 2023-12-05 ENCOUNTER — Other Ambulatory Visit: Payer: Self-pay

## 2023-12-05 DIAGNOSIS — B009 Herpesviral infection, unspecified: Secondary | ICD-10-CM

## 2023-12-05 MED ORDER — VALACYCLOVIR HCL 1 G PO TABS: 1000.0000 mg | ORAL_TABLET | Freq: Two times a day (BID) | ORAL | 0 refills | Status: AC

## 2023-12-05 NOTE — Telephone Encounter (Signed)
 Pt called in asking for a new script of the valtrex  to be sent into [facility] on [address]. She is having a herpes outbreak   Please advise   Last AEX 04/02/23  Pt sees TW

## 2024-04-06 ENCOUNTER — Ambulatory Visit: Admitting: Nurse Practitioner
